# Patient Record
Sex: Female | Born: 1970 | Smoking: Former smoker
Health system: Southern US, Community
[De-identification: ages and names within clinical notes are randomized; demographics above are authoritative.]

## PROBLEM LIST (undated history)

## (undated) DIAGNOSIS — M545 Low back pain: Secondary | ICD-10-CM

## (undated) DIAGNOSIS — K5900 Constipation, unspecified: Secondary | ICD-10-CM

## (undated) DIAGNOSIS — Z8489 Family history of other specified conditions: Secondary | ICD-10-CM

## (undated) DIAGNOSIS — J45909 Unspecified asthma, uncomplicated: Secondary | ICD-10-CM

## (undated) HISTORY — DX: Low back pain: M54.5

---

## 2012-05-09 ENCOUNTER — Ambulatory Visit
Admission: RE | Admit: 2012-05-09 | Discharge: 2012-05-09 | Disposition: A | Discharge status: Home / Self Care | Payer: Worker's Compensation | Source: Ambulatory Visit | Attending: Physical Medicine and Rehabilitation | Admitting: Physical Medicine and Rehabilitation

## 2012-05-09 ENCOUNTER — Other Ambulatory Visit: Payer: Self-pay | Admitting: Physical Medicine and Rehabilitation

## 2012-05-09 DIAGNOSIS — M545 Low back pain: Secondary | ICD-10-CM

## 2012-08-13 DIAGNOSIS — M545 Low back pain, unspecified: Secondary | ICD-10-CM

## 2012-08-13 HISTORY — DX: Low back pain, unspecified: M54.50

## 2012-10-07 ENCOUNTER — Telehealth: Payer: Self-pay | Admitting: Vascular Surgery

## 2012-10-07 NOTE — Telephone Encounter (Signed)
Message copied by Rosalyn Charters on Tue Oct 07, 2012  1:45 PM ------      Message from: Phillips Odor      Created: Tue Oct 07, 2012  1:09 PM      Regarding: needs consult appt. w/ [facility]       This pt. Needs a new patient consult. Appt. With [facility], prior to surgery on 11/13/12.  She will need to bring her LS spine films to the appt. With her.  (Dr. Edilia Bo is doing the anterior exposure prior to Dr. Shon Baton procedure on 11/13/12)

## 2012-10-17 DIAGNOSIS — M549 Dorsalgia, unspecified: Secondary | ICD-10-CM | POA: Diagnosis present

## 2012-10-17 NOTE — H&P (Signed)
History of Present Illness The patient is a 41 year old female who presents today for follow up of their back. The patient is being followed for their central back pain. They are now 2 week(s) out from L4-5 and L5-S1 facet injection. The patient has not gotten any relief of their symptoms with Cortisone injections (little improvement with facet injection).    Subjective Transcription  She returns today for follow up. Based on the independent medical exam that she had done, we elected to proceed with the facet block as recommended. The patient underwent the facet block on 08/13/12. She had bilateral 4-5, 5-1 facet injections. After discussing the results, it is clear that she did not have significant improvement in her quality of life or pain reduction following the injection. At this point, it is fair assumption that the facet is not playing a major part in her pain source. This returns me back to my original concept which was the disk as the source of her pain.  Allergies Aspirin (Salicylates)   Social History Tobacco use. former smoker   Medication History Hydrocodone-Acetaminophen (10-325MG  Tablet, Oral) Active. Cyclobenzaprine HCl (10MG  Tablet, Oral) Active. Ondansetron (4MG  Tablet Disperse, Oral) Active.   Objective Transcription  She continues to have pain with forward flexion. She continues to have significant loss in quality of life. She continues to have significant low back pain with palpation, range of motion, lateral bending. There is minor discomfort into the legs, but no persisted neurological deficits. At this point in time based on her discogram and her MRI and the failure of conservative management which has consisted of injections, physical therapy, and activity modification, she would like to proceed with surgery. Prior to the facet injection, we had discussed doing a disk replacement at L5-S1 and this is how she would like to proceed.   The  discogram demonstrated normal neucleogram at 3-4 and 4-5 with an anular tear with extravasation of fluid at 5-1. According to the discogram at L3-L4 it produced familiar pain that was rated as a 5/10. 4-5 produced no pain but 5-1 elicited severe pain which was quite condordinate with her pain. At this point, even though she had two positive levels I do not think the 3-4 level produced the same severe pain. At this point, since there is anatomical abnormality of the 5-1 disc since it did cause severe pain I do think this is her pain generator. She has had injection therapy, physical therapy, activity modification, medications and her quality of life continues to suffer and her pain continues to intensify.   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   Plans Transcription  Given the failure of conservative management, the prolonged nature of this issue, I think it is reasonable to proceed. The risks of surgery include infection, bleeding, nerve damage, death, stroke, paralysis, failure to heal, need for further surgery, ongoing or worse pain, loss of bowel and bladder control. The goal of surgery is to reduce her pain and improve her quality of life and allow her to return to gainful employment. I would expect her to be in the hospital, 2 and maybe 3 nights and then home, most likely with home health service. Then at 6 weeks postop, she is to start formalized physical therapy. Somewhere between month 3 and month 4 postop, we could begin returning to  light restricted duties with a gradual buildup. My hope would be somewhere between 9 and 12 months postop that she would be back to full duties and then be at maximal medial improvement. The patient and her Mom are present for the dictation. All  of their questions have been addressed. They have expressed an understanding of the procedure, the risks and benefits, and the expectations. No change to her current work status. I will see her again for preop once she is approved. The MRI she had done was in April which is now more than 6 months old, so I will order a new one to be done prior to surgery so that we have updated pictures. If there is significant change to that MRI, then I will see her back and we will address them and if we need to, we will alter our plans.    Alvy Beal, MD/ghe

## 2012-10-23 ENCOUNTER — Encounter: Payer: Self-pay | Admitting: Vascular Surgery

## 2012-10-28 ENCOUNTER — Other Ambulatory Visit: Payer: Self-pay

## 2012-11-04 ENCOUNTER — Encounter (HOSPITAL_COMMUNITY): Payer: Self-pay | Admitting: Pharmacy Technician

## 2012-11-04 ENCOUNTER — Encounter: Payer: Self-pay | Admitting: Vascular Surgery

## 2012-11-05 ENCOUNTER — Ambulatory Visit (INDEPENDENT_AMBULATORY_CARE_PROVIDER_SITE_OTHER): Payer: Worker's Compensation | Admitting: Vascular Surgery

## 2012-11-05 ENCOUNTER — Encounter: Payer: Self-pay | Admitting: Vascular Surgery

## 2012-11-05 VITALS — BP 114/75 | HR 66 | Resp 16 | Ht 65.0 in | Wt 144.5 lb

## 2012-11-05 DIAGNOSIS — IMO0002 Reserved for concepts with insufficient information to code with codable children: Secondary | ICD-10-CM

## 2012-11-05 NOTE — Pre-Procedure Instructions (Signed)
20 Breanna Holloway  11/05/2012   Your procedure is scheduled on:  Thursday, January 16th  Report to [facility] at 5:30AM.  Call this number if you have problems the morning of surgery: [phone]   Remember:Nothing to eat or drink after Midnight.    Take these medicines the morning of surgery with A SIP OF WATER: May use Flonase. May take Tramadol (Ultram) if needed.    Do not wear jewelry, make-up or nail polish.  Do not wear lotions, powders, or perfumes. You may wear deodorant.  Do not shave 48 hours prior to surgery. Men may shave face and neck.  Do not bring valuables to the hospital.  Contacts, dentures or bridgework may not be worn into surgery.  Leave suitcase in the car. After surgery it may be brought to your room.  For patients admitted to the hospital, checkout time is 11:00 AM the day of discharge.   Patients discharged the day of surgery will not be allowed to drive home.  Name and phone number of your driver: NA    Special Instructions: Shower using CHG 2 nights before surgery and the night before surgery.  If you shower the day of surgery use CHG.  Use special wash - you have one bottle of CHG for all showers.  You should use approximately 1/3 of the bottle for each shower.   Please read over the following fact sheets that you were given: Pain Booklet, Coughing and Deep Breathing, Blood Transfusion Information and Surgical Site Infection Prevention

## 2012-11-05 NOTE — Progress Notes (Signed)
[facility]  Patient name: Breanna Holloway MRN: [id number] DOB: [birth year] Sex: female  REASON FOR CONSULT: Request for anterior retroperitoneal exposure of L5-S1. Consult from Dr. Venita Lick  HPI: Alvena Kiernan is a 42 y.o. female who injured her back approximately a year ago playing basketball. She has had persistent low back pain since that time. She has tried physical therapy, injection therapy, and pain medicine with minimal relief. Her back pain is aggravated by any activity. The pain is relieved somewhat with leg elevation.  I have reviewed her records from Dr. Shon Baton office. 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 Dr. Shon Baton is recommended anterior lumbar interbody fusion at L5-S1. I have been asked to provide exposure.  Past Medical History  Diagnosis Date  . Lower back pain 08/13/12    Central Back L 4-5 and  L 5- S 1    Cortisone injections    Family History  Problem Relation Age of Onset  . Diabetes Mother   . Hyperlipidemia Mother   . Hypertension Mother     SOCIAL HISTORY: History  Substance Use Topics  . Smoking status: Former Smoker    Types: Cigarettes    Quit date: 11/05/2009  . Smokeless tobacco: Never Used  . Alcohol Use: No    Allergies  Allergen Reactions  . Asa (Aspirin) Swelling  . Salicylates Other (See Comments)    REACTION:  unknown    Current Outpatient Prescriptions  Medication Sig Dispense Refill  . fluticasone (FLONASE) 50 MCG/ACT nasal spray Place 1 spray into the nose daily.      . Multiple Vitamin (MULTIVITAMIN WITH MINERALS) TABS Take 1 tablet by mouth daily.      . traMADol (ULTRAM) 50 MG tablet Take 50 mg by mouth 2 (two) times daily as needed. For pain        REVIEW OF SYSTEMS: Arly.Keller ] denotes positive finding; [  ] denotes negative finding  CARDIOVASCULAR:  [ ]  chest pain   [ ]  chest pressure   [ ]  palpitations   [ ]  orthopnea   [ ]  dyspnea on exertion   [ ]  claudication   [ ]  rest  pain   [ ]  DVT   [ ]  phlebitis PULMONARY:   [ ]  productive cough   Arly.Keller ] asthma   [ ]  wheezing NEUROLOGIC:   [ ]  weakness  [ ]  paresthesias  [ ]  aphasia  [ ]  amaurosis  [ ]  dizziness HEMATOLOGIC:   [ ]  bleeding problems   [ ]  clotting disorders MUSCULOSKELETAL:  [ ]  joint pain   [ ]  joint swelling [ ]  leg swelling GASTROINTESTINAL: [ ]   blood in stool  [ ]   hematemesis GENITOURINARY:  [ ]   dysuria  [ ]   hematuria PSYCHIATRIC:  [ ]  history of major depression INTEGUMENTARY:  [ ]  rashes  [ ]  ulcers CONSTITUTIONAL:  [ ]  fever   [ ]  chills  PHYSICAL EXAM: Filed Vitals:   11/05/12 0958  BP: 114/75  Pulse: 66  Resp: 16  Height: 5\' 5"  (1.651 m)  Weight: 144 lb 8 oz (65.545 kg)  SpO2: 100%   Body mass index is 24.05 kg/(m^2). GENERAL: The patient is a well-nourished female, in no acute distress. The vital signs are documented above. CARDIOVASCULAR: There is a regular rate and rhythm. I do not detect carotid bruits. He has palpable femoral pulses and weakly palpable dorsalis pedis pulses bilaterally. She has a palpable right posterior tibial pulse. I cannot palpate  a left posterior tibial pulse. PULMONARY: There is good air exchange bilaterally without wheezing or rales. ABDOMEN: Soft and non-tender with normal pitched bowel sounds.  MUSCULOSKELETAL: There are no major deformities or cyanosis. NEUROLOGIC: No focal weakness or paresthesias are detected. SKIN: There are no ulcers or rashes noted. PSYCHIATRIC: The patient has a normal affect.  DATA:  I have reviewed her CT of the lumbar spine. Do not see any evidence of significant calcification of the aorta or significant osteophytes.  MEDICAL ISSUES:  Degeneration of intervertebral disc, site unspecified This patient appears to be a good candidate for anterior retroperitoneal exposure of L5-S1. This is scheduled for 11/13/2012.I have reviewed our role in exposure of the spine in order to allow anterior lumbar interbody fusion at the  appropriate levels. We have discussed the potential complications of surgery, including but not limited to, arterial or venous injury, thrombosis, or bleeding. We have also discussed the potential risks of wound healing problems, the development of a hernia, nerve injury, leg swelling, or other unpredictable medical problems. All the patient's questions were answered and they are agreeable to proceed.    Embry Huss S [facility] [place] Beeper: [phone]

## 2012-11-05 NOTE — Assessment & Plan Note (Signed)
This patient appears to be a good candidate for anterior retroperitoneal exposure of L5-S1. This is scheduled for 11/13/2012.I have reviewed our role in exposure of the spine in order to allow anterior lumbar interbody fusion at the appropriate levels. We have discussed the potential complications of surgery, including but not limited to, arterial or venous injury, thrombosis, or bleeding. We have also discussed the potential risks of wound healing problems, the development of a hernia, nerve injury, leg swelling, or other unpredictable medical problems. All the patient's questions were answered and they are agreeable to proceed.

## 2012-11-06 ENCOUNTER — Encounter (HOSPITAL_COMMUNITY)
Admission: RE | Admit: 2012-11-06 | Discharge: 2012-11-06 | Disposition: A | Discharge status: Home / Self Care | Payer: Worker's Compensation | Source: Ambulatory Visit | Attending: Orthopedic Surgery | Admitting: Orthopedic Surgery

## 2012-11-06 ENCOUNTER — Encounter (HOSPITAL_COMMUNITY): Payer: Self-pay

## 2012-11-06 HISTORY — DX: Constipation, unspecified: K59.00

## 2012-11-06 HISTORY — DX: Family history of other specified conditions: Z84.89

## 2012-11-06 HISTORY — DX: Unspecified asthma, uncomplicated: J45.909

## 2012-11-06 LAB — CBC
Hemoglobin: 13.6 g/dL (ref 12.0–15.0)
MCH: 31.2 pg (ref 26.0–34.0)
MCHC: 33.3 g/dL (ref 30.0–36.0)
RDW: 13.1 % (ref 11.5–15.5)

## 2012-11-06 LAB — SURGICAL PCR SCREEN
MRSA, PCR: NEGATIVE
Staphylococcus aureus: NEGATIVE

## 2012-11-06 LAB — HCG, SERUM, QUALITATIVE: Preg, Serum: NEGATIVE

## 2012-11-06 LAB — TYPE AND SCREEN: ABO/RH(D): O POS

## 2012-11-06 LAB — ABO/RH: ABO/RH(D): O POS

## 2012-11-12 MED ORDER — DEXAMETHASONE SODIUM PHOSPHATE 4 MG/ML IJ SOLN
4.0000 mg | Freq: Once | INTRAMUSCULAR | Status: AC
  Administered 2012-11-13: 10 mg via INTRAVENOUS
  Filled 2012-11-12: qty 1

## 2012-11-12 MED ORDER — CEFAZOLIN SODIUM-DEXTROSE 2-3 GM-% IV SOLR
2.0000 g | INTRAVENOUS | Status: AC
  Administered 2012-11-13: 2 g via INTRAVENOUS
  Filled 2012-11-12 (×2): qty 50

## 2012-11-13 ENCOUNTER — Ambulatory Visit (HOSPITAL_COMMUNITY): Payer: Worker's Compensation

## 2012-11-13 ENCOUNTER — Encounter (HOSPITAL_COMMUNITY)
Admission: RE | Disposition: A | Discharge status: Home / Self Care | Payer: Self-pay | Source: Ambulatory Visit | Attending: Orthopedic Surgery

## 2012-11-13 ENCOUNTER — Inpatient Hospital Stay (HOSPITAL_COMMUNITY)
Admission: RE | Admit: 2012-11-13 | Discharge: 2012-11-15 | DRG: 460 | Disposition: A | Discharge status: Home / Self Care | Payer: Worker's Compensation | Source: Ambulatory Visit | Attending: Orthopedic Surgery | Admitting: Orthopedic Surgery

## 2012-11-13 ENCOUNTER — Encounter (HOSPITAL_COMMUNITY): Payer: Self-pay | Admitting: *Deleted

## 2012-11-13 ENCOUNTER — Ambulatory Visit (HOSPITAL_COMMUNITY): Payer: Worker's Compensation | Admitting: Anesthesiology

## 2012-11-13 ENCOUNTER — Encounter (HOSPITAL_COMMUNITY): Payer: Self-pay | Admitting: Anesthesiology

## 2012-11-13 DIAGNOSIS — J45909 Unspecified asthma, uncomplicated: Secondary | ICD-10-CM | POA: Diagnosis present

## 2012-11-13 DIAGNOSIS — M5137 Other intervertebral disc degeneration, lumbosacral region: Principal | ICD-10-CM | POA: Diagnosis present

## 2012-11-13 DIAGNOSIS — Z09 Encounter for follow-up examination after completed treatment for conditions other than malignant neoplasm: Secondary | ICD-10-CM

## 2012-11-13 DIAGNOSIS — M5416 Radiculopathy, lumbar region: Secondary | ICD-10-CM

## 2012-11-13 DIAGNOSIS — M519 Unspecified thoracic, thoracolumbar and lumbosacral intervertebral disc disorder: Secondary | ICD-10-CM | POA: Diagnosis present

## 2012-11-13 DIAGNOSIS — Z79899 Other long term (current) drug therapy: Secondary | ICD-10-CM

## 2012-11-13 DIAGNOSIS — M51379 Other intervertebral disc degeneration, lumbosacral region without mention of lumbar back pain or lower extremity pain: Principal | ICD-10-CM | POA: Diagnosis present

## 2012-11-13 DIAGNOSIS — Z87891 Personal history of nicotine dependence: Secondary | ICD-10-CM

## 2012-11-13 DIAGNOSIS — Z886 Allergy status to analgesic agent status: Secondary | ICD-10-CM

## 2012-11-13 DIAGNOSIS — M549 Dorsalgia, unspecified: Secondary | ICD-10-CM | POA: Diagnosis present

## 2012-11-13 DIAGNOSIS — IMO0002 Reserved for concepts with insufficient information to code with codable children: Secondary | ICD-10-CM | POA: Diagnosis present

## 2012-11-13 HISTORY — PX: ABDOMINAL EXPOSURE: SHX5708

## 2012-11-13 HISTORY — PX: ANTERIOR LUMBAR FUSION: SHX1170

## 2012-11-13 SURGERY — ANTERIOR LUMBAR FUSION 1 LEVEL
Anesthesia: General | Site: Spine Lumbar | Wound class: Clean

## 2012-11-13 MED ORDER — PHENOL 1.4 % MT LIQD: 1.0000 | OROMUCOSAL | Status: DC | PRN

## 2012-11-13 MED ORDER — ALBUMIN HUMAN 5 % IV SOLN
INTRAVENOUS | Status: DC | PRN
  Administered 2012-11-13: 08:00:00 via INTRAVENOUS

## 2012-11-13 MED ORDER — MENTHOL 3 MG MT LOZG: 1.0000 | LOZENGE | OROMUCOSAL | Status: DC | PRN

## 2012-11-13 MED ORDER — PROPOFOL 10 MG/ML IV BOLUS
INTRAVENOUS | Status: DC | PRN
  Administered 2012-11-13: 200 mg via INTRAVENOUS

## 2012-11-13 MED ORDER — LACTATED RINGERS IV SOLN
INTRAVENOUS | Status: DC
  Administered 2012-11-13: 15:00:00 via INTRAVENOUS

## 2012-11-13 MED ORDER — GLYCOPYRROLATE 0.2 MG/ML IJ SOLN
INTRAMUSCULAR | Status: DC | PRN
  Administered 2012-11-13: 0.6 mg via INTRAVENOUS

## 2012-11-13 MED ORDER — OXYCODONE HCL 5 MG PO TABS
10.0000 mg | ORAL_TABLET | ORAL | Status: DC | PRN
  Administered 2012-11-14 – 2012-11-15 (×6): 10 mg via ORAL
  Filled 2012-11-13 (×6): qty 2

## 2012-11-13 MED ORDER — ONDANSETRON HCL 4 MG/2ML IJ SOLN: 4.0000 mg | INTRAMUSCULAR | Status: DC | PRN

## 2012-11-13 MED ORDER — SODIUM CHLORIDE 0.9 % IJ SOLN
3.0000 mL | Freq: Two times a day (BID) | INTRAMUSCULAR | Status: DC
  Administered 2012-11-13 – 2012-11-14 (×2): 3 mL via INTRAVENOUS

## 2012-11-13 MED ORDER — DEXTROSE 5 % IV SOLN
500.0000 mg | Freq: Four times a day (QID) | INTRAVENOUS | Status: DC | PRN
  Administered 2012-11-13: 500 mg via INTRAVENOUS
  Filled 2012-11-13: qty 5

## 2012-11-13 MED ORDER — MIDAZOLAM HCL 5 MG/5ML IJ SOLN
INTRAMUSCULAR | Status: DC | PRN
  Administered 2012-11-13: 2 mg via INTRAVENOUS

## 2012-11-13 MED ORDER — 0.9 % SODIUM CHLORIDE (POUR BTL) OPTIME
TOPICAL | Status: DC | PRN
  Administered 2012-11-13: 1000 mL

## 2012-11-13 MED ORDER — ROCURONIUM BROMIDE 100 MG/10ML IV SOLN
INTRAVENOUS | Status: DC | PRN
  Administered 2012-11-13 (×4): 10 mg via INTRAVENOUS
  Administered 2012-11-13: 50 mg via INTRAVENOUS

## 2012-11-13 MED ORDER — HEMOSTATIC AGENTS (NO CHARGE) OPTIME
TOPICAL | Status: DC | PRN
  Administered 2012-11-13: 1 via TOPICAL

## 2012-11-13 MED ORDER — ACETAMINOPHEN 10 MG/ML IV SOLN: 1000.0000 mg | Freq: Once | INTRAVENOUS | Status: DC

## 2012-11-13 MED ORDER — ALBUTEROL SULFATE (5 MG/ML) 0.5% IN NEBU: 2.5000 mg | INHALATION_SOLUTION | Freq: Four times a day (QID) | RESPIRATORY_TRACT | Status: DC | PRN

## 2012-11-13 MED ORDER — HYDROMORPHONE HCL PF 1 MG/ML IJ SOLN
0.2500 mg | INTRAMUSCULAR | Status: DC | PRN
  Administered 2012-11-13 (×3): 0.5 mg via INTRAVENOUS

## 2012-11-13 MED ORDER — CEFAZOLIN SODIUM 1-5 GM-% IV SOLN
1.0000 g | Freq: Three times a day (TID) | INTRAVENOUS | Status: AC
  Administered 2012-11-13 – 2012-11-14 (×2): 1 g via INTRAVENOUS
  Filled 2012-11-13 (×2): qty 50

## 2012-11-13 MED ORDER — SODIUM CHLORIDE 0.9 % IJ SOLN: 3.0000 mL | INTRAMUSCULAR | Status: DC | PRN

## 2012-11-13 MED ORDER — PROMETHAZINE HCL 25 MG/ML IJ SOLN: 6.2500 mg | INTRAMUSCULAR | Status: DC | PRN

## 2012-11-13 MED ORDER — ALBUTEROL SULFATE HFA 108 (90 BASE) MCG/ACT IN AERS
INHALATION_SPRAY | RESPIRATORY_TRACT | Status: DC | PRN
  Administered 2012-11-13: 2 via RESPIRATORY_TRACT

## 2012-11-13 MED ORDER — DEXAMETHASONE 4 MG PO TABS
4.0000 mg | ORAL_TABLET | Freq: Four times a day (QID) | ORAL | Status: DC
  Administered 2012-11-13 – 2012-11-15 (×8): 4 mg via ORAL
  Filled 2012-11-13 (×12): qty 1

## 2012-11-13 MED ORDER — HYDROMORPHONE HCL PF 1 MG/ML IJ SOLN
INTRAMUSCULAR | Status: AC
  Filled 2012-11-13: qty 1

## 2012-11-13 MED ORDER — OXYCODONE HCL 5 MG/5ML PO SOLN
5.0000 mg | Freq: Once | ORAL | Status: AC | PRN
Start: 2012-11-13 — End: 2012-11-13

## 2012-11-13 MED ORDER — MORPHINE SULFATE (PF) 1 MG/ML IV SOLN
INTRAVENOUS | Status: DC
  Administered 2012-11-13: 5 mg via INTRAVENOUS
  Administered 2012-11-13: 11:00:00 via INTRAVENOUS
  Administered 2012-11-14 (×2): 3 mg via INTRAVENOUS

## 2012-11-13 MED ORDER — OXYCODONE HCL 5 MG PO TABS
5.0000 mg | ORAL_TABLET | Freq: Once | ORAL | Status: AC | PRN
  Administered 2012-11-13: 5 mg via ORAL

## 2012-11-13 MED ORDER — LIDOCAINE HCL (CARDIAC) 20 MG/ML IV SOLN
INTRAVENOUS | Status: DC | PRN
  Administered 2012-11-13: 50 mg via INTRAVENOUS

## 2012-11-13 MED ORDER — ONDANSETRON HCL 4 MG/2ML IJ SOLN
INTRAMUSCULAR | Status: DC | PRN
  Administered 2012-11-13: 4 mg via INTRAVENOUS

## 2012-11-13 MED ORDER — DIPHENHYDRAMINE HCL 50 MG/ML IJ SOLN: 12.5000 mg | Freq: Four times a day (QID) | INTRAMUSCULAR | Status: DC | PRN

## 2012-11-13 MED ORDER — FLUTICASONE PROPIONATE 50 MCG/ACT NA SUSP
1.0000 | Freq: Every day | NASAL | Status: DC
  Administered 2012-11-13 – 2012-11-15 (×3): 1 via NASAL
  Filled 2012-11-13: qty 16

## 2012-11-13 MED ORDER — ZOLPIDEM TARTRATE 5 MG PO TABS: 5.0000 mg | ORAL_TABLET | Freq: Every evening | ORAL | Status: DC | PRN

## 2012-11-13 MED ORDER — NALOXONE HCL 0.4 MG/ML IJ SOLN: 0.4000 mg | INTRAMUSCULAR | Status: DC | PRN

## 2012-11-13 MED ORDER — NEOSTIGMINE METHYLSULFATE 1 MG/ML IJ SOLN
INTRAMUSCULAR | Status: DC | PRN
  Administered 2012-11-13: 3 mg via INTRAVENOUS

## 2012-11-13 MED ORDER — DIPHENHYDRAMINE HCL 12.5 MG/5ML PO ELIX: 12.5000 mg | ORAL_SOLUTION | Freq: Four times a day (QID) | ORAL | Status: DC | PRN

## 2012-11-13 MED ORDER — MORPHINE SULFATE (PF) 1 MG/ML IV SOLN
INTRAVENOUS | Status: AC
  Filled 2012-11-13: qty 25

## 2012-11-13 MED ORDER — DEXAMETHASONE SODIUM PHOSPHATE 4 MG/ML IJ SOLN
4.0000 mg | Freq: Four times a day (QID) | INTRAMUSCULAR | Status: DC
  Filled 2012-11-13 (×12): qty 1

## 2012-11-13 MED ORDER — ONDANSETRON HCL 4 MG/2ML IJ SOLN: 4.0000 mg | Freq: Four times a day (QID) | INTRAMUSCULAR | Status: DC | PRN

## 2012-11-13 MED ORDER — DEXAMETHASONE SODIUM PHOSPHATE 10 MG/ML IJ SOLN
INTRAMUSCULAR | Status: AC
  Filled 2012-11-13: qty 1

## 2012-11-13 MED ORDER — SODIUM CHLORIDE 0.9 % IV SOLN: 250.0000 mL | INTRAVENOUS | Status: DC

## 2012-11-13 MED ORDER — METHOCARBAMOL 500 MG PO TABS
500.0000 mg | ORAL_TABLET | Freq: Four times a day (QID) | ORAL | Status: DC | PRN
  Filled 2012-11-13: qty 1

## 2012-11-13 MED ORDER — SODIUM CHLORIDE 0.9 % IJ SOLN: 9.0000 mL | INTRAMUSCULAR | Status: DC | PRN

## 2012-11-13 MED ORDER — LACTATED RINGERS IV SOLN
INTRAVENOUS | Status: DC | PRN
  Administered 2012-11-13 (×2): via INTRAVENOUS

## 2012-11-13 MED ORDER — ENOXAPARIN SODIUM 30 MG/0.3ML ~~LOC~~ SOLN
30.0000 mg | SUBCUTANEOUS | Status: DC
  Administered 2012-11-14 (×2): 30 mg via SUBCUTANEOUS
  Filled 2012-11-13 (×3): qty 0.3

## 2012-11-13 MED ORDER — OXYCODONE HCL 5 MG PO TABS
ORAL_TABLET | ORAL | Status: AC
  Filled 2012-11-13: qty 1

## 2012-11-13 MED ORDER — FENTANYL CITRATE 0.05 MG/ML IJ SOLN
INTRAMUSCULAR | Status: DC | PRN
  Administered 2012-11-13: 150 ug via INTRAVENOUS
  Administered 2012-11-13: 100 ug via INTRAVENOUS
  Administered 2012-11-13: 50 ug via INTRAVENOUS
  Administered 2012-11-13: 100 ug via INTRAVENOUS

## 2012-11-13 SURGICAL SUPPLY — 83 items
APPLIER CLIP 11 MED OPEN (CLIP) ×6
BLADE SURG 10 STRL SS (BLADE) ×3 IMPLANT
BLADE SURG ROTATE 9660 (MISCELLANEOUS) IMPLANT
CLIP APPLIE 11 MED OPEN (CLIP) ×4 IMPLANT
CLOTH BEACON ORANGE TIMEOUT ST (SAFETY) ×6 IMPLANT
CLSR STERI-STRIP ANTIMIC 1/2X4 (GAUZE/BANDAGES/DRESSINGS) ×3 IMPLANT
CORDS BIPOLAR (ELECTRODE) ×3 IMPLANT
COVER SURGICAL LIGHT HANDLE (MISCELLANEOUS) ×6 IMPLANT
COVER TABLE BACK 60X90 (DRAPES) ×3 IMPLANT
DERMABOND ADVANCED (GAUZE/BANDAGES/DRESSINGS)
DERMABOND ADVANCED .7 DNX12 (GAUZE/BANDAGES/DRESSINGS) IMPLANT
DRAPE C-ARM 42X72 X-RAY (DRAPES) ×6 IMPLANT
DRAPE INCISE IOBAN 66X45 STRL (DRAPES) IMPLANT
DRAPE POUCH INSTRU U-SHP 10X18 (DRAPES) ×3 IMPLANT
DRAPE SURG 17X23 STRL (DRAPES) ×3 IMPLANT
DRAPE U-SHAPE 47X51 STRL (DRAPES) ×3 IMPLANT
DRSG MEPILEX BORDER 4X8 (GAUZE/BANDAGES/DRESSINGS) ×3 IMPLANT
DURAPREP 26ML APPLICATOR (WOUND CARE) ×3 IMPLANT
ELECT BLADE 4.0 EZ CLEAN MEGAD (MISCELLANEOUS) ×3
ELECT CAUTERY BLADE 6.4 (BLADE) ×3 IMPLANT
ELECT REM PT RETURN 9FT ADLT (ELECTROSURGICAL) ×3
ELECTRODE BLDE 4.0 EZ CLN MEGD (MISCELLANEOUS) ×2 IMPLANT
ELECTRODE REM PT RTRN 9FT ADLT (ELECTROSURGICAL) ×2 IMPLANT
GAUZE SPONGE 4X4 16PLY XRAY LF (GAUZE/BANDAGES/DRESSINGS) IMPLANT
GLOVE BIOGEL PI IND STRL 6.5 (GLOVE) IMPLANT
GLOVE BIOGEL PI IND STRL 8 (GLOVE) ×2 IMPLANT
GLOVE BIOGEL PI IND STRL 8.5 (GLOVE) ×4 IMPLANT
GLOVE BIOGEL PI INDICATOR 6.5 (GLOVE)
GLOVE BIOGEL PI INDICATOR 8 (GLOVE) ×1
GLOVE BIOGEL PI INDICATOR 8.5 (GLOVE) ×2
GLOVE ECLIPSE 6.0 STRL STRAW (GLOVE) IMPLANT
GLOVE ECLIPSE 7.5 STRL STRAW (GLOVE) ×3 IMPLANT
GLOVE ECLIPSE 8.5 STRL (GLOVE) ×6 IMPLANT
GOWN PREVENTION PLUS XXLARGE (GOWN DISPOSABLE) ×6 IMPLANT
GOWN STRL NON-REIN LRG LVL3 (GOWN DISPOSABLE) ×9 IMPLANT
HEMOSTAT SNOW SURGICEL 2X4 (HEMOSTASIS) IMPLANT
INLAY POLYETHYLENE W/TANTALUM (Orthopedic Implant) ×3 IMPLANT
INSERT FOGARTY 61MM (MISCELLANEOUS) IMPLANT
INSERT FOGARTY SM (MISCELLANEOUS) IMPLANT
KIT BASIN OR (CUSTOM PROCEDURE TRAY) ×3 IMPLANT
KIT ROOM TURNOVER OR (KITS) ×6 IMPLANT
LOOP VESSEL MAXI BLUE (MISCELLANEOUS) ×3 IMPLANT
LOOP VESSEL MINI RED (MISCELLANEOUS) ×3 IMPLANT
NEEDLE SPNL 18GX3.5 QUINCKE PK (NEEDLE) ×3 IMPLANT
NS IRRIG 1000ML POUR BTL (IV SOLUTION) ×3 IMPLANT
PACK LAMINECTOMY ORTHO (CUSTOM PROCEDURE TRAY) ×3 IMPLANT
PACK UNIVERSAL I (CUSTOM PROCEDURE TRAY) ×3 IMPLANT
PAD ARMBOARD 7.5X6 YLW CONV (MISCELLANEOUS) ×12 IMPLANT
PLATE END SUPERIOR (Plate) ×3 IMPLANT
PLATE INFERIOR END (Plate) ×3 IMPLANT
SPONGE INTESTINAL PEANUT (DISPOSABLE) ×6 IMPLANT
SPONGE LAP 18X18 X RAY DECT (DISPOSABLE) ×3 IMPLANT
SPONGE LAP 4X18 X RAY DECT (DISPOSABLE) ×3 IMPLANT
SPONGE SURGIFOAM ABS GEL 100 (HEMOSTASIS) ×3 IMPLANT
STAPLER VISISTAT 35W (STAPLE) IMPLANT
STRIP CLOSURE SKIN 1/2X4 (GAUZE/BANDAGES/DRESSINGS) ×3 IMPLANT
SURGIFLO TRUKIT (HEMOSTASIS) ×3 IMPLANT
SUT MNCRL AB 3-0 PS2 18 (SUTURE) ×3 IMPLANT
SUT PDS AB 1 CTX 36 (SUTURE) ×3 IMPLANT
SUT PROLENE 4 0 RB 1 (SUTURE) ×4
SUT PROLENE 4-0 RB1 .5 CRCL 36 (SUTURE) ×8 IMPLANT
SUT PROLENE 5 0 C 1 24 (SUTURE) IMPLANT
SUT PROLENE 5 0 CC1 (SUTURE) IMPLANT
SUT PROLENE 6 0 C 1 30 (SUTURE) IMPLANT
SUT PROLENE 6 0 CC (SUTURE) IMPLANT
SUT SILK 0 TIES 10X30 (SUTURE) ×3 IMPLANT
SUT SILK 2 0 TIES 10X30 (SUTURE) ×6 IMPLANT
SUT SILK 2 0SH CR/8 30 (SUTURE) IMPLANT
SUT SILK 3 0 TIES 10X30 (SUTURE) ×6 IMPLANT
SUT SILK 3 0SH CR/8 30 (SUTURE) IMPLANT
SUT VIC AB 0 CT1 27 (SUTURE) ×1
SUT VIC AB 0 CT1 27XBRD ANBCTR (SUTURE) ×2 IMPLANT
SUT VIC AB 1 CT1 27 (SUTURE) ×2
SUT VIC AB 1 CT1 27XBRD ANBCTR (SUTURE) ×4 IMPLANT
SUT VIC AB 2-0 CT1 18 (SUTURE) ×3 IMPLANT
SUT VIC AB 2-0 CTB1 (SUTURE) IMPLANT
SUT VIC AB 3-0 SH 27 (SUTURE) ×1
SUT VIC AB 3-0 SH 27X BRD (SUTURE) ×2 IMPLANT
SYR BULB IRRIGATION 50ML (SYRINGE) ×3 IMPLANT
TOWEL OR 17X24 6PK STRL BLUE (TOWEL DISPOSABLE) ×6 IMPLANT
TOWEL OR 17X26 10 PK STRL BLUE (TOWEL DISPOSABLE) ×6 IMPLANT
TRAY FOLEY CATH 14FRSI W/METER (CATHETERS) ×3 IMPLANT
WATER STERILE IRR 1000ML POUR (IV SOLUTION) ×3 IMPLANT

## 2012-11-13 NOTE — H&P (Signed)
No change in clinical exam H+P reviewed  

## 2012-11-13 NOTE — Progress Notes (Signed)
UR COMPLETED  

## 2012-11-13 NOTE — Plan of Care (Signed)
Problem: Consults Goal: Diagnosis - Spinal Surgery Thoraco/Lumbar Spine Fusion     

## 2012-11-13 NOTE — Op Note (Signed)
NAMEClarise Holloway  MRN: [id number] DOB: November 02, 1970    DATE OF OPERATION: 11/13/2012  PREOP DIAGNOSIS: degenerative disc disease L5-S1  POSTOP DIAGNOSIS: same  PROCEDURE: anterior retroperitoneal exposure of L5-S1  SURGEON: Di Kindle. Edilia Bo, MD, FACS  ASSIST: Jolaine Artist, RNFA  ANESTHESIA: Gen.   EBL: minimal  INDICATIONS: Breanna Holloway is a 42 y.o. female with degenerative disc disease at L5-S1. She was felt to be a good candidate for ALIF. I was asked to provide anterior retroperitoneal exposure.  FINDINGS: degenerative disc disease L5-S1  TECHNIQUE: The patient was brought to the operating room and received a general anesthetic. The abdomen was prepped and draped in usual sterile fashion after the level of the L5-S1 disc was marked. A transverse incision was made on the left at the level that had been marked. The incision was carried down to the anterior rectus sheath. The anterior rectus sheath was divided transversely and then mobilized superiorly and inferiorly from the rectus abdominis muscle. The rectus abdominis muscle was mobilized circumferentially and initially retracted laterally. This allowed exposure of the transversalis fascia laterally. This was incised allowing entry into the retroperitoneal space. The retroperitoneal space was dissected free to the level of the psoas. And then the external and internal iliac arteries were identified. The dissection was continued superiorly into the patient's right allowing exposure of the sacral promontory. The middle sacral vessels were cauterized. The disc was then exposed and the reverse lip retractors were placed on the right and then left using the Thompson retractor to allow adequate exposure of the L5-S1 disc. The ALIF was performed by Dr. Shon Baton as dictated separately. The remainder of the procedure is as dictated by Dr. Shon Baton.  Waverly Ferrari, MD, FACS [facility]  DATE OF  DICTATION:   11/13/2012

## 2012-11-13 NOTE — Anesthesia Procedure Notes (Signed)
Procedure Name: Intubation Date/Time: 11/13/2012 7:42 AM Performed by: Gwenyth Allegra Pre-anesthesia Checklist: Timeout performed, Patient identified, Emergency Drugs available, Suction available and Patient being monitored Oxygen Delivery Method: Circle system utilized Preoxygenation: Pre-oxygenation with 100% oxygen Intubation Type: IV induction Ventilation: Mask ventilation without difficulty Laryngoscope Size: 3 Grade View: Grade I Tube type: Oral Tube size: 7.0 mm Number of attempts: 1 Airway Equipment and Method: Stylet Secured at: 21 cm Tube secured with: Tape Dental Injury: Teeth and Oropharynx as per pre-operative assessment

## 2012-11-13 NOTE — Anesthesia Postprocedure Evaluation (Signed)
Anesthesia Post Note  Patient: Breanna Holloway  Procedure(s) Performed: Procedure(s) (LRB): ANTERIOR LUMBAR FUSION 1 LEVEL (N/A) ABDOMINAL EXPOSURE (N/A)  Anesthesia type: general  Patient location: PACU  Post pain: Pain level controlled  Post assessment: Patient's Cardiovascular Status Stable  Last Vitals:  Filed Vitals:   11/13/12 1215  BP:   Pulse:   Temp: 36.6 C  Resp:     Post vital signs: Reviewed and stable  Level of consciousness: sedated  Complications: No apparent anesthesia complications

## 2012-11-13 NOTE — Op Note (Signed)
NAMEJAXIE, Breanna Holloway NO.:  [id number]  MEDICAL RECORD NO.:  [id number]  LOCATION:  5N09C                        FACILITY:  [facility]  PHYSICIAN:  Alvy Beal, MD    DATE OF BIRTH:  July 26, 1971  DATE OF PROCEDURE:  11/13/2012 DATE OF DISCHARGE:                              OPERATIVE REPORT   PREOPERATIVE DIAGNOSIS:  Discogenic low back pain, L5-S1.  POSTOPERATIVE DIAGNOSIS:  Discogenic low back pain, L5-S1.  OPERATIVE PROCEDURE:  Total disk arthroplasty, L5-S1.  INSTRUMENTATION SYSTEM USED:  ProDisc lumbar, medium size 10, 11 degree lordotic implant.  COMPLICATIONS:  No complications.  APPROACH SURGEON:  Di Kindle. Edilia Bo, M.D.  HISTORY:  This is a very pleasant woman who injured herself several years ago on the job.  Despite long-term appropriate conservative management, her symptoms continued to progress and her quality of life continued to deteriorate.  As a result of the failure to improve despite conservative management, we elected to proceed with surgery.  All appropriate risks, benefits, alternatives were discussed and consent was obtained.  OPERATIVE NOTE:  The patient was brought to the operating room and placed supine on the operating table.  After successful induction of general anesthesia and endotracheal intubation, TEDs, SCDs, and a Foley were inserted.  The abdomen was prepped and draped in a standard fashion.  Once this was done, Dr. Edilia Bo then scrubbed in and performed a standard retroperitoneal approach to the lumbar spine.  Please refer to his dictation for specifics.  Once the [facility] self-retracting blades were placed and we had excellent visualization of the L5-S1 disk, I scrubbed into the case and began the diskectomy portion of the surgery.  I placed a needle into the 5-1 disk space and took an x-ray to confirm that this was the appropriate level.  Once this was done, I then incised the 5-1 disk and performed an  annulotomy with a #10 blade scalpel.  I then used curettes and pituitary rongeurs to remove all the disk material at the L5-S1 level.  Once I had scraped the disk to expose subchondral bone, I proceeded with my decompression posteriorly.  Once I was at the posterior rim of the vertebral body, I took a small angled curette and released the anulus from the L5 and S1 posterior portions of the vertebral body.  I then removed all the remaining portions of the disk until I had just looking anterior and posterior anulus which was released.  I then under live fluoro confirmed that I had parallel distraction of the endplates.  Once this was done, I then placed a trial device into the spine and malleted to the appropriate depth.  Once I confirmed satisfactory position in the lateral plane, I went to the AP and then confirmed that my implant was midline based on the spinous process.  Once this was done, I marked the midline and then took the chill and created a thin cut into the L5 and S1 endplates.  I then took the appropriate-sized implant and malleted down to the same depth as the trial was.  Once this was done, I then took the _______ and placed it on the implanting device and placed a catheter.  I then confirmed that there was  no step-off and no gap.  I then removed the implanting devices and then irrigated the wound copiously with normal saline.  I placed bone wax over the exposed bleeding bone edges and then made sure I had hemostasis using bipolar electrocautery.  I then irrigated copiously with normal saline.  I then sequentially removed the retractors.  There was no bleeding noted.  At this point, I then took final AP and lateral x-rays confirming satisfactory position of the implant.  Final KUB digital x-ray was taken and confirmed there was no retained surgical instrumentation in the abdomen.  The counts were then correct.  I then closed the rectus fascia with a running Prolene suture.   Then, I did a running #1 Vicryl suture, interrupted 2-0 Vicryl sutures, and then 3-0 Monocryl for the skin.  Steri-Strips and dry dressing were applied.  The patient was then extubated and transferred to PACU without incident.  At the end of the case, all needle and sponge counts were correct.     Alvy Beal, MD     DDB/MEDQ  D:  11/13/2012  T:  11/13/2012  Job:  [id number]

## 2012-11-13 NOTE — Progress Notes (Signed)
VASCULAR LAB PRELIMINARY  PRELIMINARY  PRELIMINARY  PRELIMINARY  Bilateral lower extremity venous duplex  completed.    Preliminary report:  Bilateral:  No evidence of DVT, superficial thrombosis, or Baker's Cyst.    Breanna Holloway, RVT 11/13/2012, 3:18 PM

## 2012-11-13 NOTE — Interval H&P Note (Signed)
History and Physical Interval Note:  11/13/2012 7:06 AM  Breanna Holloway  has presented today for surgery, with the diagnosis of discogenic lumbar back pain  The various methods of treatment have been discussed with the patient and family. After consideration of risks, benefits and other options for treatment, the patient has consented to  Procedure(s) (LRB) with comments: ANTERIOR LUMBAR FUSION 1 LEVEL (N/A) - total disc replacement vs ALIF L5-S1 ABDOMINAL EXPOSURE (N/A) as a surgical intervention .  The patient's history has been reviewed, patient examined, no change in status, stable for surgery.  I have reviewed the patient's chart and labs.  Questions were answered to the patient's satisfaction.     Josue Falconi S

## 2012-11-13 NOTE — Transfer of Care (Signed)
Immediate Anesthesia Transfer of Care Note  Patient: Breanna Holloway  Procedure(s) Performed: Procedure(s) (LRB) with comments: ANTERIOR LUMBAR FUSION 1 LEVEL (N/A) - total disc replacement L5-S1 ABDOMINAL EXPOSURE (N/A) - exposure for anterior lumbar surgery  Patient Location: PACU  Anesthesia Type:General  Level of Consciousness: awake and alert   Airway & Oxygen Therapy: Patient Spontanous Breathing and Patient connected to nasal cannula oxygen  Post-op Assessment: Report given to PACU RN and Post -op Vital signs reviewed and stable  Post vital signs: Reviewed and stable  Complications: No apparent anesthesia complications

## 2012-11-13 NOTE — Preoperative (Signed)
Beta Blockers   Reason not to administer Beta Blockers:Not Applicable2 

## 2012-11-13 NOTE — Brief Op Note (Signed)
11/13/2012  10:22 AM  PATIENT:  Breanna Holloway  42 y.o. female  PRE-OPERATIVE DIAGNOSIS:  discogenic lumbar back pain  POST-OPERATIVE DIAGNOSIS:  discogenic lumbar back pain  PROCEDURE:  Procedure(s) (LRB) with comments: ANTERIOR LUMBAR FUSION 1 LEVEL (N/A) - total disc replacement L5-S1 ABDOMINAL EXPOSURE (N/A) - exposure for anterior lumbar surgery  SURGEON:  Surgeon(s) and Role: Panel 1:    * Venita Lick, MD - Primary  Panel 2:    * Chuck Hint, MD - Primary  PHYSICIAN ASSISTANT:   ASSISTANTS: approach surgeon: Dr Edilia Bo   ANESTHESIA:   general  EBL:  Total I/O In: 1250 [I.V.:1000; IV Piggyback:250] Out: 550 [Urine:450; Blood:100]  BLOOD ADMINISTERED:none  DRAINS: none   LOCAL MEDICATIONS USED:  none  SPECIMEN:  No Specimen  DISPOSITION OF SPECIMEN:  N/A  COUNTS:  YES  TOURNIQUET:  * No tourniquets in log *  DICTATION: .Other Dictation: Dictation Number [id number]  PLAN OF CARE: Admit to inpatient   PATIENT DISPOSITION:  PACU - hemodynamically stable.

## 2012-11-13 NOTE — Anesthesia Preprocedure Evaluation (Addendum)
Anesthesia Evaluation  Patient identified by MRN, date of birth, ID band Patient awake    Reviewed: Allergy & Precautions, H&P , NPO status , Patient's Chart, lab work & pertinent test results  History of Anesthesia Complications Negative for: history of anesthetic complications  Airway Mallampati: I TM Distance: >3 FB Neck ROM: Full    Dental  (+) Teeth Intact and Dental Advisory Given   Pulmonary asthma , former smoker,    Pulmonary exam normal       Cardiovascular negative cardio ROS      Neuro/Psych negative neurological ROS  negative psych ROS   GI/Hepatic negative GI ROS, Neg liver ROS,   Endo/Other  negative endocrine ROS  Renal/GU negative Renal ROS     Musculoskeletal   Abdominal   Peds  Hematology   Anesthesia Other Findings   Reproductive/Obstetrics                          Anesthesia Physical Anesthesia Plan  ASA: II  Anesthesia Plan: General   Post-op Pain Management:    Induction: Intravenous  Airway Management Planned: Oral ETT  Additional Equipment:   Intra-op Plan:   Post-operative Plan: Extubation in OR  Informed Consent: I have reviewed the patients History and Physical, chart, labs and discussed the procedure including the risks, benefits and alternatives for the proposed anesthesia with the patient or authorized representative who has indicated his/her understanding and acceptance.   Dental advisory given  Plan Discussed with: CRNA, Anesthesiologist and Surgeon  Anesthesia Plan Comments:        Anesthesia Quick Evaluation

## 2012-11-13 NOTE — Progress Notes (Signed)
VASCULAR PROGRESS NOTE  SUBJECTIVE: Pain adequately controlled.   PHYSICAL EXAM: Filed Vitals:   11/13/12 1400  BP: 102/57  Pulse: 93  Temp: 97.9 F (36.6 C)  Resp: 15   Palpable pedal pulses on left.  ASSESSMENT/PLAN: Doing well postop.  Cari Caraway Beeper: [phone] 11/13/2012

## 2012-11-13 NOTE — H&P (View-Only) (Signed)
[facility]  Patient name: Breanna Holloway MRN: [id number] DOB: 01/31/1971 Sex: female  REASON FOR CONSULT: Request for anterior retroperitoneal exposure of L5-S1. Consult from Dr. Dahari Brooks  HPI: Breanna Holloway is a 41 y.o. female who injured her back approximately a year ago playing basketball. She has had persistent low back pain since that time. She has tried physical therapy, injection therapy, and pain medicine with minimal relief. Her back pain is aggravated by any activity. The pain is relieved somewhat with leg elevation.  I have reviewed her records from Dr. Brooks office. 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 Dr. Brooks is recommended anterior lumbar interbody fusion at L5-S1. I have been asked to provide exposure.  Past Medical History  Diagnosis Date  . Lower back pain 08/13/12    Central Back L 4-5 and  L 5- S 1    Cortisone injections    Family History  Problem Relation Age of Onset  . Diabetes Mother   . Hyperlipidemia Mother   . Hypertension Mother     SOCIAL HISTORY: History  Substance Use Topics  . Smoking status: Former Smoker    Types: Cigarettes    Quit date: 11/05/2009  . Smokeless tobacco: Never Used  . Alcohol Use: No    Allergies  Allergen Reactions  . Asa (Aspirin) Swelling  . Salicylates Other (See Comments)    REACTION:  unknown    Current Outpatient Prescriptions  Medication Sig Dispense Refill  . fluticasone (FLONASE) 50 MCG/ACT nasal spray Place 1 spray into the nose daily.      . Multiple Vitamin (MULTIVITAMIN WITH MINERALS) TABS Take 1 tablet by mouth daily.      . traMADol (ULTRAM) 50 MG tablet Take 50 mg by mouth 2 (two) times daily as needed. For pain        REVIEW OF SYSTEMS: [X ] denotes positive finding; [  ] denotes negative finding  CARDIOVASCULAR:  [ ] chest pain   [ ] chest pressure   [ ] palpitations   [ ] orthopnea   [ ] dyspnea on exertion   [ ] claudication   [ ] rest  pain   [ ] DVT   [ ] phlebitis PULMONARY:   [ ] productive cough   [X ] asthma   [ ] wheezing NEUROLOGIC:   [ ] weakness  [ ] paresthesias  [ ] aphasia  [ ] amaurosis  [ ] dizziness HEMATOLOGIC:   [ ] bleeding problems   [ ] clotting disorders MUSCULOSKELETAL:  [ ] joint pain   [ ] joint swelling [ ] leg swelling GASTROINTESTINAL: [ ]  blood in stool  [ ]  hematemesis GENITOURINARY:  [ ]  dysuria  [ ]  hematuria PSYCHIATRIC:  [ ] history of major depression INTEGUMENTARY:  [ ] rashes  [ ] ulcers CONSTITUTIONAL:  [ ] fever   [ ] chills  PHYSICAL EXAM: Filed Vitals:   11/05/12 0958  BP: 114/75  Pulse: 66  Resp: 16  Height: 5' 5" (1.651 m)  Weight: 144 lb 8 oz (65.545 kg)  SpO2: 100%   Body mass index is 24.05 kg/(m^2). GENERAL: The patient is a well-nourished female, in no acute distress. The vital signs are documented above. CARDIOVASCULAR: There is a regular rate and rhythm. I do not detect carotid bruits. He has palpable femoral pulses and weakly palpable dorsalis pedis pulses bilaterally. She has a palpable right posterior tibial pulse. I cannot palpate   a left posterior tibial pulse. PULMONARY: There is good air exchange bilaterally without wheezing or rales. ABDOMEN: Soft and non-tender with normal pitched bowel sounds.  MUSCULOSKELETAL: There are no major deformities or cyanosis. NEUROLOGIC: No focal weakness or paresthesias are detected. SKIN: There are no ulcers or rashes noted. PSYCHIATRIC: The patient has a normal affect.  DATA:  I have reviewed her CT of the lumbar spine. Do not see any evidence of significant calcification of the aorta or significant osteophytes.  MEDICAL ISSUES:  Degeneration of intervertebral disc, site unspecified This patient appears to be a good candidate for anterior retroperitoneal exposure of L5-S1. This is scheduled for 11/13/2012.I have reviewed our role in exposure of the spine in order to allow anterior lumbar interbody fusion at the  appropriate levels. We have discussed the potential complications of surgery, including but not limited to, arterial or venous injury, thrombosis, or bleeding. We have also discussed the potential risks of wound healing problems, the development of a hernia, nerve injury, leg swelling, or other unpredictable medical problems. All the patient's questions were answered and they are agreeable to proceed.    Jackelyn Illingworth S [facility] Beeper: [phone]    

## 2012-11-14 ENCOUNTER — Encounter (HOSPITAL_COMMUNITY): Payer: Self-pay | Admitting: Orthopedic Surgery

## 2012-11-14 ENCOUNTER — Inpatient Hospital Stay (HOSPITAL_COMMUNITY): Payer: Worker's Compensation

## 2012-11-14 MED ORDER — METHOCARBAMOL 500 MG PO TABS: 500.0000 mg | ORAL_TABLET | Freq: Three times a day (TID) | ORAL | Status: AC | PRN

## 2012-11-14 MED ORDER — ONDANSETRON HCL 4 MG PO TABS: 4.0000 mg | ORAL_TABLET | Freq: Three times a day (TID) | ORAL | Status: AC | PRN

## 2012-11-14 MED ORDER — OXYCODONE-ACETAMINOPHEN 10-325 MG PO TABS: 1.0000 | ORAL_TABLET | ORAL | Status: AC | PRN

## 2012-11-14 MED ORDER — DOCUSATE SODIUM 100 MG PO CAPS: 100.0000 mg | ORAL_CAPSULE | Freq: Three times a day (TID) | ORAL | Status: AC | PRN

## 2012-11-14 MED ORDER — ENOXAPARIN SODIUM 30 MG/0.3ML ~~LOC~~ SOLN: 30.0000 mg | Freq: Two times a day (BID) | SUBCUTANEOUS | Status: AC

## 2012-11-14 MED ORDER — POLYETHYLENE GLYCOL 3350 17 GM/SCOOP PO POWD
17.0000 g | Freq: Every day | ORAL | Status: AC
Start: 2012-11-14 — End: ?

## 2012-11-14 MED ORDER — POLYETHYLENE GLYCOL 3350 17 G PO PACK
17.0000 g | PACK | Freq: Every day | ORAL | Status: DC
  Administered 2012-11-14: 17 g via ORAL
  Filled 2012-11-14 (×2): qty 1

## 2012-11-14 MED FILL — Sodium Chloride IV Soln 0.9%: INTRAVENOUS | Qty: 1000 | Status: AC

## 2012-11-14 MED FILL — Sodium Chloride Irrigation Soln 0.9%: Qty: 3000 | Status: AC

## 2012-11-14 MED FILL — Heparin Sodium (Porcine) Inj 1000 Unit/ML: INTRAMUSCULAR | Qty: 30 | Status: AC

## 2012-11-14 NOTE — Evaluation (Signed)
Physical Therapy Evaluation Patient Details Name: Breanna Holloway MRN: [id number] DOB: 12/26/1970 Today's Date: 11/14/2012 Time: 0912-0931 PT Time Calculation (min): 19 min  PT Assessment / Plan / Recommendation Clinical Impression  pt present with L5-S1 ALIF.  pt moving well and very motivated.  pt needing to go to x-ray at end of session, so discussed with pt sitting up in chair on return to room with Nsg A.  Anticipate great progress.      PT Assessment  Patient needs continued PT services    Follow Up Recommendations  Supervision - Intermittent;Home health PT    Does the patient have the potential to tolerate intense rehabilitation      Barriers to Discharge None      Equipment Recommendations  Rolling walker with 5" wheels    Recommendations for Other Services OT consult   Frequency Min 5X/week    Precautions / Restrictions Precautions Precautions: Back Precaution Booklet Issued: Yes (comment) Precaution Comments: Gave pt English copy of back precautions.  Will look for Spanish version.   Required Braces or Orthoses: Spinal Brace Spinal Brace: Applied in sitting position Restrictions Weight Bearing Restrictions: No   Pertinent Vitals/Pain Indicates pain is minimal.        Mobility  Bed Mobility Bed Mobility: Rolling Right;Right Sidelying to Sit;Sitting - Scoot to [place] of Bed;Sit to Sidelying Right Rolling Right: 5: Supervision Right Sidelying to Sit: 5: Supervision Sitting - Scoot to Edge of Bed: 5: Supervision Sit to Sidelying Right: 5: Supervision Details for Bed Mobility Assistance: cues for log roll technique and back precautions, but pt able to perform without physical A.   Transfers Transfers: Sit to Stand;Stand to Sit Sit to Stand: 4: Min guard;With upper extremity assist;From bed;From toilet Stand to Sit: 4: Min guard;With upper extremity assist;To toilet;To bed Details for Transfer Assistance: cues for UE use and controlling descent to toilet  and bed.   Ambulation/Gait Ambulation/Gait Assistance: 4: Min guard Ambulation Distance (Feet): 20 Feet (x2) Assistive device: Rolling walker Ambulation/Gait Assistance Details: cues for use of RW, upright posture, positioning in RW.   Gait Pattern: Step-through pattern;Decreased stride length Stairs: No Wheelchair Mobility Wheelchair Mobility: No    Shoulder Instructions     Exercises     PT Diagnosis: Difficulty walking  PT Problem List: Decreased activity tolerance;Decreased balance;Decreased mobility;Decreased knowledge of use of DME;Decreased knowledge of precautions PT Treatment Interventions: DME instruction;Gait training;Stair training;Functional mobility training;Therapeutic activities;Therapeutic exercise;Balance training;Neuromuscular re-education;Patient/family education   PT Goals Acute Rehab PT Goals PT Goal Formulation: With patient Time For Goal Achievement: 11/21/12 Potential to Achieve Goals: Good Pt will Roll Supine to Right Side: with modified independence PT Goal: Rolling Supine to Right Side - Progress: Goal set today Pt will go Supine/Side to Sit: with modified independence PT Goal: Supine/Side to Sit - Progress: Goal set today Pt will go Sit to Supine/Side: with modified independence PT Goal: Sit to Supine/Side - Progress: Goal set today Pt will go Sit to Stand: with modified independence PT Goal: Sit to Stand - Progress: Goal set today Pt will go Stand to Sit: with modified independence PT Goal: Stand to Sit - Progress: Goal set today Pt will Ambulate: >150 feet;with modified independence;with rolling walker PT Goal: Ambulate - Progress: Goal set today Pt will Go Up / Down Stairs: 3-5 stairs;with min assist;with least restrictive assistive device PT Goal: Up/Down Stairs - Progress: Goal set today Additional Goals Additional Goal #1: pt will verbalize and follow back precautions.   PT Goal: Additional Goal #  1 - Progress: Goal set today  Visit  Information  Last PT Received On: 11/14/12 Assistance Needed: +1    Subjective Data  Subjective: It's not too bad.   Patient Stated Goal: Home    Prior Functioning  Home Living Lives With:  (Mother) Available Help at Discharge: Family;Available 24 hours/day Type of Home: House Home Access: Stairs to enter [employer] of Steps: 3 or 4 Entrance Stairs-Rails: None Home Layout: One level Home Adaptive Equipment: None Prior Function Level of Independence: Independent Able to Take Stairs?: Yes Vocation: Full time employment Communication Communication: Prefers language other than English (Speaks English well, but Spanish is primary language.  )    Cognition  Overall Cognitive Status: Appears within functional limits for tasks assessed/performed Arousal/Alertness: Awake/alert Orientation Level: Appears intact for tasks assessed Behavior During Session: [facility] for tasks performed    Extremity/Trunk Assessment Right Lower Extremity Assessment RLE ROM/Strength/Tone: WFL for tasks assessed RLE Sensation: WFL - Light Touch Left Lower Extremity Assessment LLE ROM/Strength/Tone: WFL for tasks assessed LLE Sensation: WFL - Light Touch Trunk Assessment Trunk Assessment: Normal   Balance Balance Balance Assessed: No  End of Session PT - End of Session Equipment Utilized During Treatment: Gait belt;Back brace Activity Tolerance: Patient tolerated treatment well Patient left: in bed;with family/visitor present (with transport to go to X-ray.  ) Nurse Communication: Mobility status  GP     Marlynn Hinckley, Alison Murray, PT [phone] 11/14/2012, 10:58 AM

## 2012-11-14 NOTE — Evaluation (Signed)
Occupational Therapy Evaluation Patient Details Name: Breanna Holloway MRN: [id number] DOB: Mar 23, 1971 Today's Date: 11/14/2012 Time: 9147-8295 OT Time Calculation (min): 31 min  OT Assessment / Plan / Recommendation Clinical Impression  Pt demos decline in function with ADLs, strength, balance and safety following back surgery. Pt would benefit from skilled OT services to address these impairments to restore PLOF to return home safely    OT Assessment  Patient needs continued OT Services    Follow Up Recommendations       Barriers to Discharge None    Equipment Recommendations  3 in 1 bedside comode;Tub/shower bench;Other (comment) (ADL A/E kit)    Recommendations for Other Services    Frequency  Min 2X/week    Precautions / Restrictions Precautions Precautions: Back Precaution Comments: Pt able to recall 3/3 back precautions Required Braces or Orthoses: Spinal Brace Spinal Brace: Applied in sitting position Restrictions Weight Bearing Restrictions: No   Pertinent Vitals/Pain 2/10    ADL  Grooming: Performed;Wash/dry hands;Wash/dry face;Min guard Where Assessed - Grooming: Supported standing Upper Body Bathing: Simulated;Supervision/safety;Set up Where Assessed - Upper Body Bathing: Unsupported sitting Lower Body Bathing: Simulated;Minimal assistance Where Assessed - Lower Body Bathing: Supported sitting;Supported sit to stand Upper Body Dressing: Performed;Supervision/safety;Set up Where Assessed - Upper Body Dressing: Unsupported sitting Lower Body Dressing: Performed;Minimal assistance Where Assessed - Lower Body Dressing: Unsupported sitting;Supported sit to stand Toilet Transfer: Performed;Min guard Toilet Transfer Method: Other (comment) (ambulating from RW level) Toilet Transfer Equipment: Raised toilet seat with arms (or 3-in-1 over toilet);Grab bars Toileting - Clothing Manipulation and Hygiene: Performed;Minimal assistance Where Assessed - International aid/development worker and Hygiene: Standing Tub/Shower Transfer: Performed;Min guard Web designer Method: Science writer: Information systems manager with back;Grab bars Equipment Used: Rolling walker;Sock aid;Long-handled shoe horn;Long-handled sponge;Reacher (3 in 1, tub bench) ADL Comments: Pt and her sister educated with demo on ADL A/E for home use, tub bench trg    OT Diagnosis: Generalized weakness  OT Problem List: Decreased strength;Decreased knowledge of use of DME or AE;Pain;Decreased activity tolerance OT Treatment Interventions: Self-care/ADL training;Therapeutic activities;Therapeutic exercise;Neuromuscular education;DME and/or AE instruction;Patient/family education;Balance training   OT Goals Acute Rehab OT Goals OT Goal Formulation: With patient Time For Goal Achievement: 11/21/12 Potential to Achieve Goals: Good ADL Goals Pt Will Perform Grooming: with set-up;with supervision;Standing at sink ADL Goal: Grooming - Progress: Goal set today Pt Will Perform Lower Body Bathing: with set-up;with supervision;Sitting in shower;Sit to stand in shower;Supported;with adaptive equipment ADL Goal: Lower Body Bathing - Progress: Goal set today Pt Will Perform Lower Body Dressing: with supervision;with set-up;Supported;Sitting, chair;Sitting, bed;Sit to stand from chair;Sit to stand from bed;with adaptive equipment ADL Goal: Lower Body Dressing - Progress: Goal set today Pt Will Transfer to Toilet: with supervision;with DME;Grab bars ADL Goal: Toilet Transfer - Progress: Goal set today Pt Will Perform Toileting - Clothing Manipulation: with supervision;Standing ADL Goal: Toileting - Clothing Manipulation - Progress: Goal set today Pt Will Perform Toileting - Hygiene: with supervision;Sit to stand from 3-in-1/toilet;with caregiver independent in assisting ADL Goal: Toileting - Hygiene - Progress: Goal set today Pt Will Perform Tub/Shower Transfer: with  supervision;Transfer tub bench;Grab bars;Maintaining back safety precautions;with DME ADL Goal: Tub/Shower Transfer - Progress: Goal set today  Visit Information  Last OT Received On: 11/14/12    Subjective Data  Subjective: " I am not hurting that much right now " Patient Stated Goal: To return home   Prior Functioning     Home Living Lives With: Other (Comment) (mother and teenage dtr)  Available Help at Discharge: Family;Available 24 hours/day Type of Home: House Home Access: Stairs to enter [employer] of Steps: 3 Entrance Stairs-Rails: None Home Layout: One level Bathroom Shower/Tub: Engineer, manufacturing systems: Standard Home Adaptive Equipment: None Prior Function Level of Independence: Independent Able to Take Stairs?: Yes Driving: Yes Vocation: Full time employment Communication Communication: No difficulties;Prefers language other than English;Other (comment) (Spanish is primary langauge, speaks [place] well) Dominant Hand: Right         Vision/Perception Vision - Assessment Eye Alignment: Within Functional Limits Perception Perception: Within Functional Limits   Cognition  Overall Cognitive Status: Appears within functional limits for tasks assessed/performed Arousal/Alertness: Awake/alert Orientation Level: Appears intact for tasks assessed Behavior During Session: [facility] for tasks performed    Extremity/Trunk Assessment Right Upper Extremity Assessment RUE ROM/Strength/Tone: Within functional levels Left Upper Extremity Assessment LUE ROM/Strength/Tone: Within functional levels     Mobility Bed Mobility Bed Mobility: Rolling Right;Right Sidelying to Sit;Sitting - Scoot to Edge of Bed;Sit to Sidelying Right Right Sidelying to Sit: 5: Supervision Sitting - Scoot to Edge of Bed: 5: Supervision Sit to Sidelying Right: 5: Supervision Details for Bed Mobility Assistance: able to perform log roll without cuing Transfers Transfers: Sit to  Stand;Stand to Sit Sit to Stand: 4: Min guard;From bed;From chair/3-in-1;Other (comment);With upper extremity assist (tub bench) Stand to Sit: To chair/3-in-1;To bed;With upper extremity assist;With armrests;Other (comment) (tub bench)     Shoulder Instructions     Exercise     Balance Balance Balance Assessed: No   End of Session OT - End of Session Equipment Utilized During Treatment: Back brace;Other (comment) (RW, 3 in 1 tub bench) Activity Tolerance: Patient tolerated treatment well Patient left: in bed;with call bell/phone within reach;with family/visitor present  GO     Galen Manila 11/14/2012, 3:02 PM

## 2012-11-14 NOTE — Progress Notes (Signed)
    Subjective: Procedure(s) (LRB): ANTERIOR LUMBAR FUSION 1 LEVEL (N/A) ABDOMINAL EXPOSURE (N/A) 1 Day Post-Op  Patient reports pain as 3 on 0-10 scale.  Reports decreased leg pain reports incisional back pain   Foley removed - no void as of yet Negative bowel movement Positive flatus Negative chest pain or shortness of breath  Objective: Vital signs in last 24 hours: Temp:  [97.2 F (36.2 C)-98.9 F (37.2 C)] 98.9 F (37.2 C) (01/17 0654) Pulse Rate:  [77-93] 85  (01/17 0654) Resp:  [10-16] 16  (01/17 0654) BP: (102-123)/(56-65) 104/56 mmHg (01/17 0654) SpO2:  [100 %] 100 % (01/17 0654) FiO2 (%):  [38 %] 38 % (01/16 2000)  Intake/Output from previous day: 01/16 0701 - 01/17 0700 In: 3619 [P.O.:360; I.V.:3009; IV Piggyback:250] Out: 1575 [Urine:1475; Blood:100]  Labs: No results found for this basename: WBC:2,RBC:2,HCT:2,PLT:2 in the last 72 hours No results found for this basename: NA:2,K:2,CL:2,CO2:2,BUN:2,CREATININE:2,GLUCOSE:2,CALCIUM:2 in the last 72 hours No results found for this basename: LABPT:2,INR:2 in the last 72 hours  Physical Exam: Neurologically intact ABD soft Neurovascular intact Intact pulses distally Dorsiflexion/Plantar flexion intact Incision: dressing C/D/I and no drainage Compartment soft  Assessment/Plan: Patient stable  xrays pending Continue mobilization with physical therapy Continue care  Advance diet Up with therapy D/C IV fluids Remove foley this AM Mobilization with PT today Await LE doppler today Possible d/c over there week-end  Venita Lick, MD [facility] [phone]

## 2012-11-14 NOTE — Progress Notes (Signed)
VASCULAR PROGRESS NOTE  SUBJECTIVE: Comfortable   PHYSICAL EXAM: Filed Vitals:   11/13/12 2000 11/14/12 0000 11/14/12 0400 11/14/12 0654  BP:    104/56  Pulse:    85  Temp:    98.9 F (37.2 C)  TempSrc:      Resp: 13 14 11 16   SpO2: 100% 100% 100% 100%   Palpable pedal pulses on left Dressing dry  ASSESSMENT/PLAN: 1. 1 Day Post-Op s/p:Anterior retroperitoneal exposure of L5-S1 2. Doing well. Will be available if needed.  Cari Caraway Beeper: [phone] 11/14/2012

## 2012-11-14 NOTE — Discharge Summary (Signed)
Patient ID: Breanna Holloway MRN: [id number] DOB/AGE: [birth year] 41 y.o.  Admit date: 11/13/2012 Discharge date: 11/14/2012  Admission Diagnoses:  Principal Problem:  *Back pain Active Problems:  Degeneration of intervertebral disc, site unspecified   Discharge Diagnoses:  Principal Problem:  *Back pain Active Problems:  Degeneration of intervertebral disc, site unspecified  status post Procedure(s): ANTERIOR LUMBAR FUSION 1 LEVEL ABDOMINAL EXPOSURE  Past Medical History  Diagnosis Date  . Lower back pain 08/13/12    Central Back L 4-5 and  L 5- S 1    Cortisone injections  . Family history of anesthesia complication     N/V  . Asthma   . Constipation     Surgeries: Procedure(s): ANTERIOR LUMBAR FUSION 1 LEVEL ABDOMINAL EXPOSURE on 11/13/2012   Consultants: Treatment Team:  Chuck Hint, MD  Discharged Condition: Improved  Hospital Course: Breanna Holloway is an 42 y.o. female who was admitted 11/13/2012 for operative treatment of Back pain. Patient failed conservative treatments (please see the history and physical for the specifics) and had severe unremitting pain that affects sleep, daily activities and work/hobbies. After pre-op clearance, the patient was taken to the operating room on 11/13/2012 and underwent  Procedure(s): ANTERIOR LUMBAR FUSION 1 LEVEL ABDOMINAL EXPOSURE.    Patient was given perioperative antibiotics: Anti-infectives     Start     Dose/Rate Route Frequency Ordered Stop   11/13/12 1600   ceFAZolin (ANCEF) IVPB 1 g/50 mL premix        1 g 100 mL/hr over 30 Minutes Intravenous Every 8 hours 11/13/12 1301 11/14/12 0125   11/12/12 1403   ceFAZolin (ANCEF) IVPB 2 g/50 mL premix        2 g 100 mL/hr over 30 Minutes Intravenous 30 min pre-op 11/12/12 1403 11/13/12 0800           Patient was given sequential compression devices and early ambulation to prevent DVT.   Patient benefited maximally from hospital stay and  there were no complications. At the time of discharge, the patient was urinating/moving their bowels without difficulty, tolerating a regular diet, pain is controlled with oral pain medications and they have been cleared by PT/OT.   Recent vital signs: Patient Vitals for the past 24 hrs:  BP Temp Pulse Resp SpO2  11/14/12 0654 104/56 mmHg 98.9 F (37.2 C) 85  16  100 %  11/14/12 0400 - - - 11  100 %  11/14/12 0000 - - - 14  100 %  11/13/12 2000 - - - 13  100 %  11/13/12 1600 - - 80  12  100 %  11/13/12 1400 102/57 mmHg 97.9 F (36.6 C) 93  15  100 %  11/13/12 1215 - 97.9 F (36.6 C) - - -  11/13/12 1200 107/60 mmHg - 77  10  100 %  11/13/12 1145 106/58 mmHg - - - -  11/13/12 1130 111/63 mmHg - - - -  11/13/12 1120 123/65 mmHg - 93  12  100 %  11/13/12 1034 - 97.2 F (36.2 C) - - 100 %     Recent laboratory studies: No results found for this basename: WBC:2,HGB:2,HCT:2,PLT:2,NA:2,K:2,CL:2,CO2:2,BUN:2,CREATININE:2,GLUCOSE:2,PT:2,INR:2,CALCIUM,2: in the last 72 hours   Discharge Medications:     Medication List     As of 11/14/2012  7:26 AM    STOP taking these medications         traMADol 50 MG tablet   Commonly known as: ULTRAM      TAKE  these medications         albuterol (5 MG/ML) 0.5% nebulizer solution   Commonly known as: PROVENTIL   Take 2.5 mg by nebulization every 6 (six) hours as needed.      docusate sodium 100 MG capsule   Commonly known as: COLACE   Take 1 capsule (100 mg total) by mouth 3 (three) times daily as needed for constipation.      enoxaparin 30 MG/0.3ML injection   Commonly known as: LOVENOX   Inject 0.3 mLs (30 mg total) into the skin every 12 (twelve) hours.      fluticasone 50 MCG/ACT nasal spray   Commonly known as: FLONASE   Place 1 spray into the nose daily.      methocarbamol 500 MG tablet   Commonly known as: ROBAXIN   Take 1 tablet (500 mg total) by mouth 3 (three) times daily as needed.      multivitamin with minerals Tabs    Take 1 tablet by mouth daily.      ondansetron 4 MG tablet   Commonly known as: ZOFRAN   Take 1 tablet (4 mg total) by mouth every 8 (eight) hours as needed for nausea.      oxyCODONE-acetaminophen 10-325 MG per tablet   Commonly known as: PERCOCET   Take 1 tablet by mouth every 4 (four) hours as needed for pain.      polyethylene glycol powder powder   Commonly known as: GLYCOLAX/MIRALAX   Take 17 g by mouth daily.        Diagnostic Studies: Dg Lumbar Spine 2-3 Views  11/13/2012  *RADIOLOGY REPORT*  Clinical Data: L5-S1 DISC REPLACEMENT.  DG C-ARM 61-120 MIN,LUMBAR SPINE - 2-3 VIEW  Comparison: None.  Findings: Changes of disc replacement at L5-S1.  Normal alignment. No hardware or bony complicating feature.  IMPRESSION: L5-S1 anterior fusion.   Original Report Authenticated By: Charlett Nose, M.D.    Dg Lumbar Spine 2-3 Views  11/13/2012  *RADIOLOGY REPORT*  Clinical Data: Preop anterior lumbar fusion  LUMBAR SPINE - 2-3 VIEW  Comparison: Lumbar spine CT dated 05/09/2012  Findings: Five lumbar-type vertebral bodies.  Normal lumbar lordosis.  No evidence of fracture or dislocation.  Vertebral body heights intervertebral disc spaces are maintained.  Mild degenerative changes at L2-3.  IMPRESSION: Mild degenerative changes.   Original Report Authenticated By: Charline Bills, M.D.    Dg C-arm [phone] Min  11/13/2012  *RADIOLOGY REPORT*  Clinical Data: L5-S1 DISC REPLACEMENT.  DG C-ARM 61-120 MIN,LUMBAR SPINE - 2-3 VIEW  Comparison: None.  Findings: Changes of disc replacement at L5-S1.  Normal alignment. No hardware or bony complicating feature.  IMPRESSION: L5-S1 anterior fusion.   Original Report Authenticated By: Charlett Nose, M.D.    Dg Or Local Abdomen  11/13/2012  *RADIOLOGY REPORT*  Clinical Data: Post L5-S1 ALIF  OR LOCAL ABDOMEN  Comparison: 11/13/2012 at 0754 hours  Findings: Postsurgical changes at L5-S1.  No radiopaque foreign body is seen.  IMPRESSION: No radiopaque foreign body  is seen.   Original Report Authenticated By: Charline Bills, M.D.         Discharge Plan:  discharge to home  Disposition:  Stable Tolerating reg diet Ok for home d/c - f/u 2 weeks lovenox for 10 days for DVT prevention   Signed: Venita Lick D for Dr. Venita Lick [facility] [phone] 11/14/2012, 7:26 AM

## 2012-11-14 NOTE — Plan of Care (Signed)
Problem: Phase II Progression Outcomes Goal: Discharge plan established Recommend HH OT for ADL and ADL mobility safety, tub bench, 3 in 1 and ADL A/E kit for home after acute care d/c

## 2012-11-15 NOTE — Progress Notes (Signed)
Occupational Therapy Treatment Patient Details Name: Breanna Holloway MRN: [id number] DOB: 07/23/1971 Today's Date: 11/15/2012 Time: 0930-1000 OT Time Calculation (min): 30 min  OT Assessment / Plan / Recommendation Comments on Treatment Session      Follow Up Recommendations       Barriers to Discharge       Equipment Recommendations  3 in 1 bedside comode;Tub/shower bench;Other (comment) (ADL A/E kit)    Recommendations for Other Services    Frequency Min 2X/week   Plan Discharge plan remains appropriate    Precautions / Restrictions Precautions Precautions: Back Required Braces or Orthoses: Spinal Brace Spinal Brace: Applied in sitting position   Pertinent Vitals/Pain 0/10 for pain    ADL  Grooming: Performed;Wash/dry face;Supervision/safety (cues for tech. for maintianing back precautions during task) Where Assessed - Grooming: Supported standing Upper Body Dressing: Performed;Supervision/safety;Set up (able to don back brace ) Toilet Transfer: Performed;Supervision/safety Toilet Transfer Method: Other (comment) (ambulating in b.room with rw) Toilet Transfer Equipment: Raised toilet seat with arms (or 3-in-1 over toilet) Toileting - Clothing Manipulation and Hygiene: Simulated;Supervision/safety Where Assessed - Toileting Clothing Manipulation and Hygiene: Standing Transfers/Ambulation Related to ADLs: in/out of bed, ambulating in room ADL Comments: pt. verbalized understanding of a/e that was reviewed with her yesterday states she has no questions, also states she feels comfortable with tub transfer that was performed yesterday    OT Diagnosis:    OT Problem List:   OT Treatment Interventions:     OT Goals ADL Goals ADL Goal: Grooming - Progress: Met ADL Goal: Toilet Transfer - Progress: Met ADL Goal: Toileting - Clothing Manipulation - Progress: Met ADL Goal: Toileting - Hygiene - Progress: Met  Visit Information  Last OT Received On:  11/15/12 Assistance Needed: +1    Subjective Data  Subjective: "i am so happy that i do not have a lot of pain"   Prior Functioning       Cognition  Overall Cognitive Status: Appears within functional limits for tasks assessed/performed Arousal/Alertness: Awake/alert Orientation Level: Appears intact for tasks assessed Behavior During Session: [facility] for tasks performed    Mobility  Shoulder Instructions Bed Mobility Bed Mobility: Rolling Left;Right Sidelying to Sit;Supine to Sit;Sitting - Scoot to [place] of Bed Rolling Right: 5: Supervision Right Sidelying to Sit: 5: Supervision;HOB flat Supine to Sit: 5: Supervision;HOB flat Sitting - Scoot to Edge of Bed: 5: Supervision Sit to Sidelying Right: 5: Supervision;HOB flat Details for Bed Mobility Assistance: peformed log rolling without cues and able to with hob flat and no rails to simulate bed at home Transfers Transfers: Sit to Stand;Stand to Sit Sit to Stand: 5: Supervision Stand to Sit: To chair/3-in-1;To bed       Exercises      Balance     End of Session    GO     Robet Leu 11/15/2012, 10:17 AM

## 2012-11-15 NOTE — Progress Notes (Signed)
Pt to go home on lovenox. lovenox teaching done with pt and family. Mock return demonstration complete. Pt and family verbalize understanding. All questions answered.

## 2012-11-15 NOTE — Progress Notes (Signed)
NCM spoke to pt and states she is working with Case Worker, Amy Fannis # [phone] ext [phone]. States she is needing RW, 3n1 and tub bench. Explained she can Building surveyor from gift shop or DME supply store. Will fax orders to Case Worker for [facility] for set up and DME reimbursement to [facility]. Will follow up with Case Worker on 11/17/2012.  Isidoro Donning RN CCM Case Mgmt phone [phone]

## 2012-11-15 NOTE — Progress Notes (Signed)
Physical Therapy Treatment Patient Details Name: Breanna Holloway MRN: [id number] DOB: Mar 20, 1971 Today's Date: 11/15/2012 Time: 3086-5784 PT Time Calculation (min): 13 min  PT Assessment / Plan / Recommendation Comments on Treatment Session  Patient progressing well. Plans on discharge today. Waiting for equipment to be delivered    Follow Up Recommendations  Supervision - Intermittent     Does the patient have the potential to tolerate intense rehabilitation     Barriers to Discharge        Equipment Recommendations  Rolling walker with 5" wheels    Recommendations for Other Services    Frequency Min 5X/week   Plan Discharge plan remains appropriate;Frequency remains appropriate    Precautions / Restrictions Precautions Precautions: Back Precaution Comments: patient able to recall and maintain all precautions Required Braces or Orthoses: Spinal Brace Spinal Brace: Applied in sitting position   Pertinent Vitals/Pain     Mobility  Bed Mobility Bed Mobility: Rolling Left;Right Sidelying to Sit;Supine to Sit;Sitting - Scoot to [place] of Bed Rolling Right: 6: Modified independent (Device/Increase time) Right Sidelying to Sit: 6: Modified independent (Device/Increase time) Supine to Sit: 5: Supervision;HOB flat Sitting - Scoot to Edge of Bed: 5: Supervision Sit to Sidelying Right: 5: Supervision;HOB flat Details for Bed Mobility Assistance: peformed log rolling without cues and able to with hob flat and no rails to simulate bed at home Transfers Sit to Stand: 6: Modified independent (Device/Increase time) Stand to Sit: 6: Modified independent (Device/Increase time) Ambulation/Gait Ambulation/Gait Assistance: 5: Supervision Ambulation Distance (Feet): 200 Feet Assistive device: None Ambulation/Gait Assistance Details: Patient guarded but no LOB without RW Gait Pattern: Step-through pattern Stairs: Yes Stairs Assistance: 4: Min guard Stair Management Technique:  Alternating pattern;Forwards Number of Stairs: 4     Exercises     PT Diagnosis:    PT Problem List:   PT Treatment Interventions:     PT Goals Acute Rehab PT Goals PT Goal: Rolling Supine to Right Side - Progress: Met PT Goal: Supine/Side to Sit - Progress: Met PT Goal: Sit to Supine/Side - Progress: Met PT Goal: Sit to Stand - Progress: Met PT Goal: Stand to Sit - Progress: Met PT Goal: Ambulate - Progress: Progressing toward goal PT Goal: Up/Down Stairs - Progress: Met Additional Goals PT Goal: Additional Goal #1 - Progress: Met  Visit Information  Last PT Received On: 11/15/12 Assistance Needed: +1    Subjective Data      Cognition  Overall Cognitive Status: Appears within functional limits for tasks assessed/performed Arousal/Alertness: Awake/alert Orientation Level: Appears intact for tasks assessed Behavior During Session: [facility] for tasks performed    Balance     End of Session PT - End of Session Equipment Utilized During Treatment: Gait belt Activity Tolerance: Patient tolerated treatment well Patient left: in bed;with call bell/phone within reach Nurse Communication: Mobility status   GP     Fredrich Birks 11/15/2012, 10:38 AM 11/15/2012 Fredrich Birks PTA [phone] pager [phone] office

## 2012-11-17 NOTE — Progress Notes (Signed)
NCM made outreach to [employer], Macarthur Critchley [phone], ext [phone] with [employer]. Amy Fannis, [employer] rep, ext [phone] is out of the office. Left voice message for return call to fax over paperwork post d/c. Isidoro Donning RN CCM Case Mgmt phone [phone]

## 2012-11-17 NOTE — Progress Notes (Signed)
Received call back from Macarthur Critchley, [employer], Fax orders, facesheet, and d/c summary to [employer] rep at # [phone]. Isidoro Donning RN CCM Case Mgmt phone [phone]

## 2013-08-10 IMAGING — RF DG LUMBAR SPINE 2-3V
1 series · 2 of 2 positions shown · non-contrast
Comparison: None.

CLINICAL DATA: L5-S1 DISC REPLACEMENT.

DG C-ARM 61-120 MIN,LUMBAR SPINE - 2-3 VIEW

[Series 1: run · 2 of 2 slices shown]
[im 1/2]
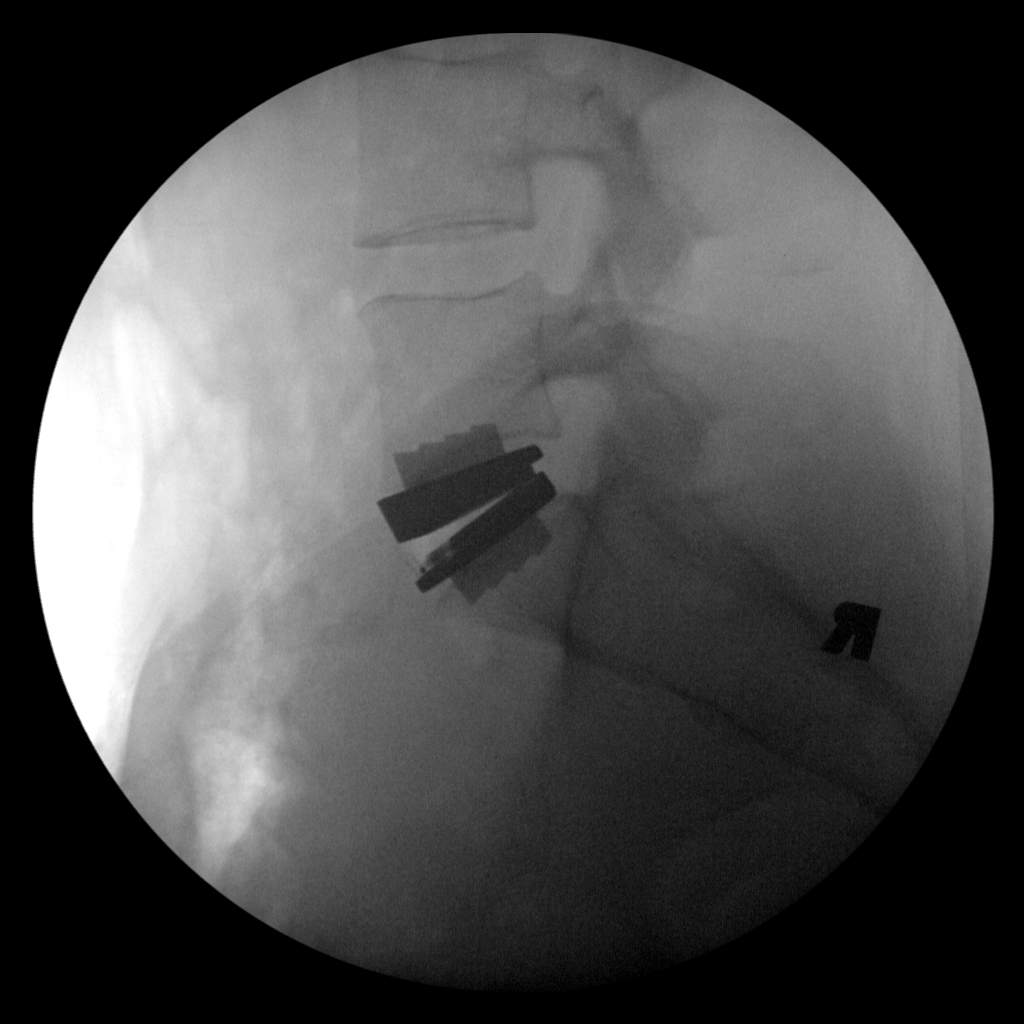
[im 2/2]
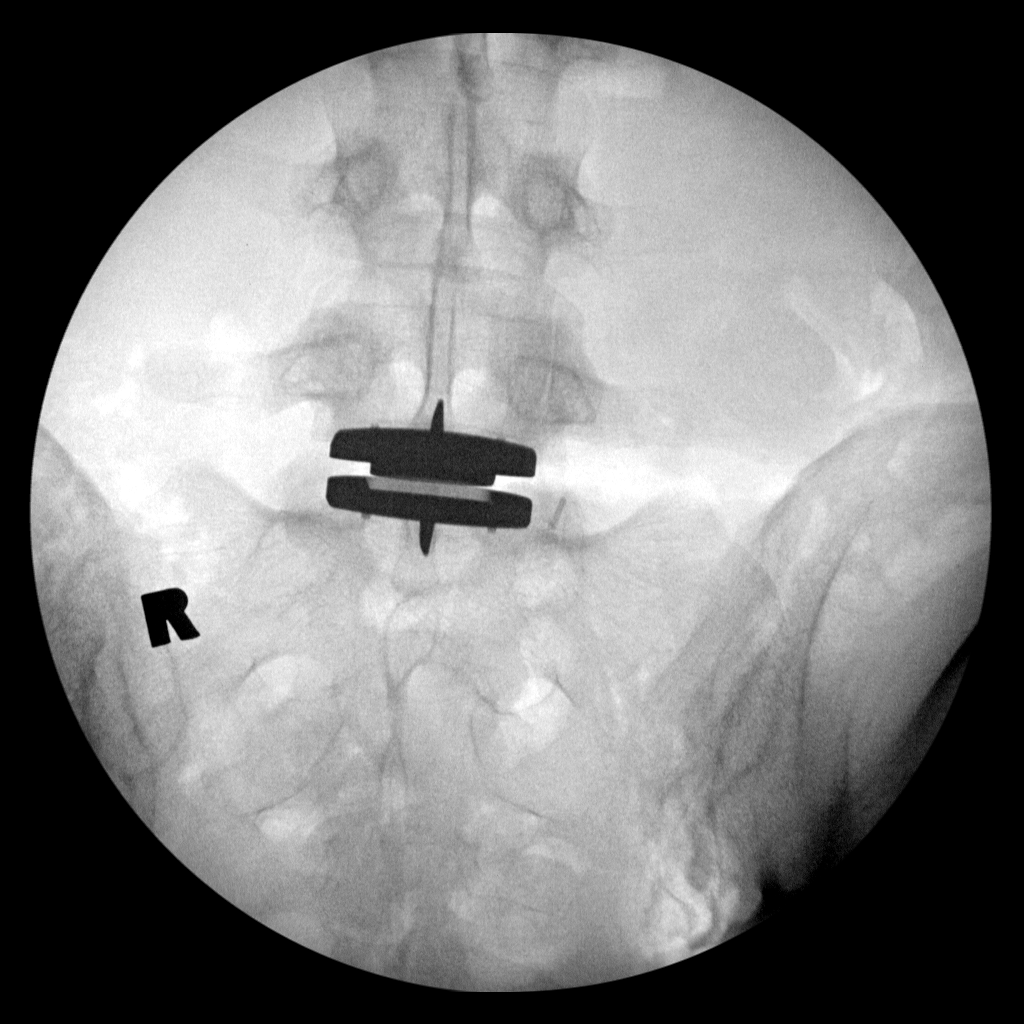

[2 of 2 positions shown; findings below may reference images not displayed]

FINDINGS: Changes of disc replacement at L5-S1.  Normal alignment.
No hardware or bony complicating feature.
IMPRESSION: L5-S1 anterior fusion.

## 2013-08-11 IMAGING — CR DG LUMBAR SPINE 2-3V
2 series · 2 of 2 positions shown · non-contrast
Comparison: Post discogram CT 05/09/2012.  Intraoperative films
11/13/2012.

CLINICAL DATA: Status post total disc replacement

LUMBAR SPINE - 2-3 VIEW

[t l-spine a.p.]
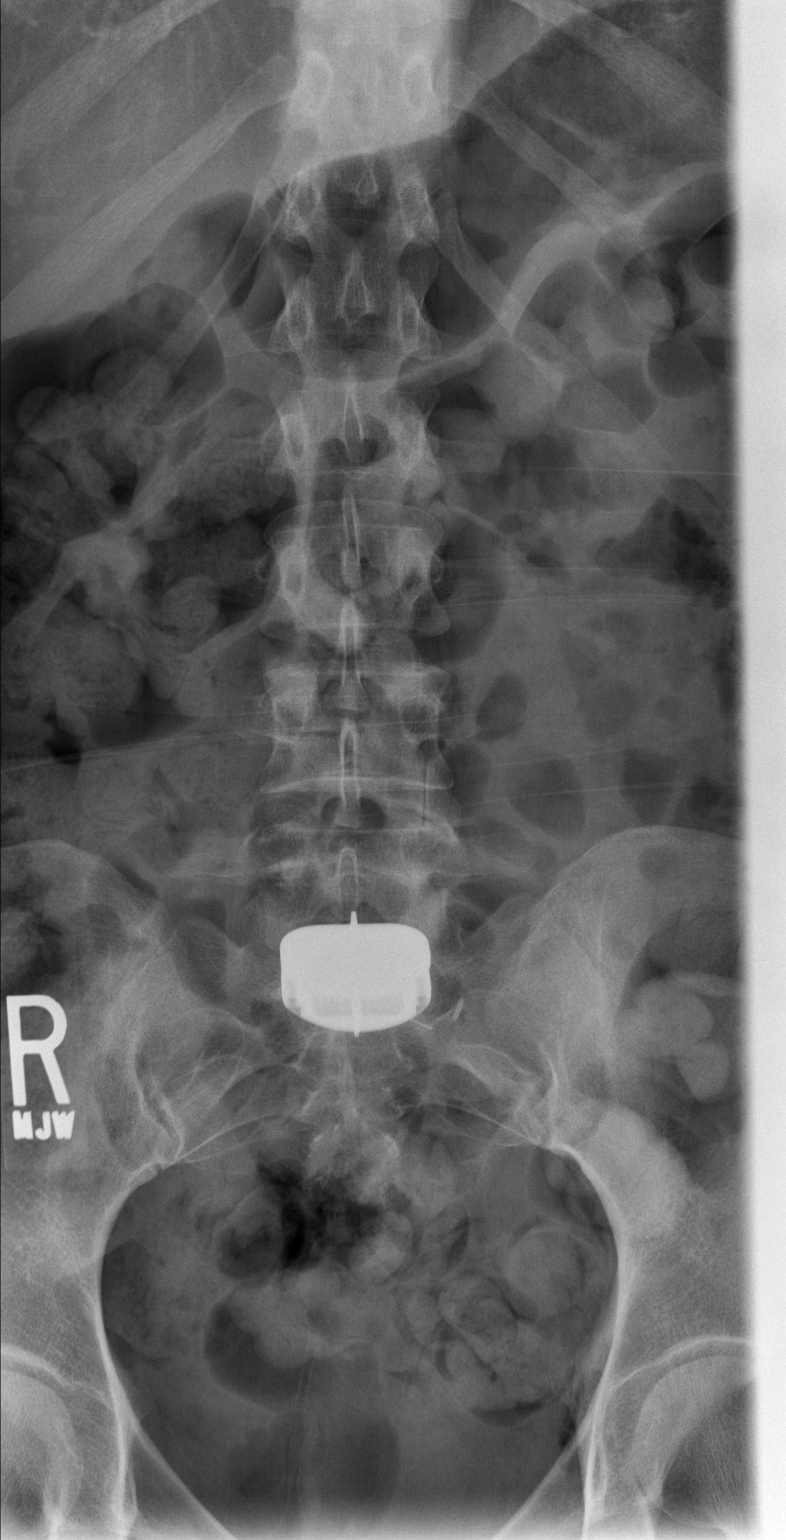

[t l-spine lat]
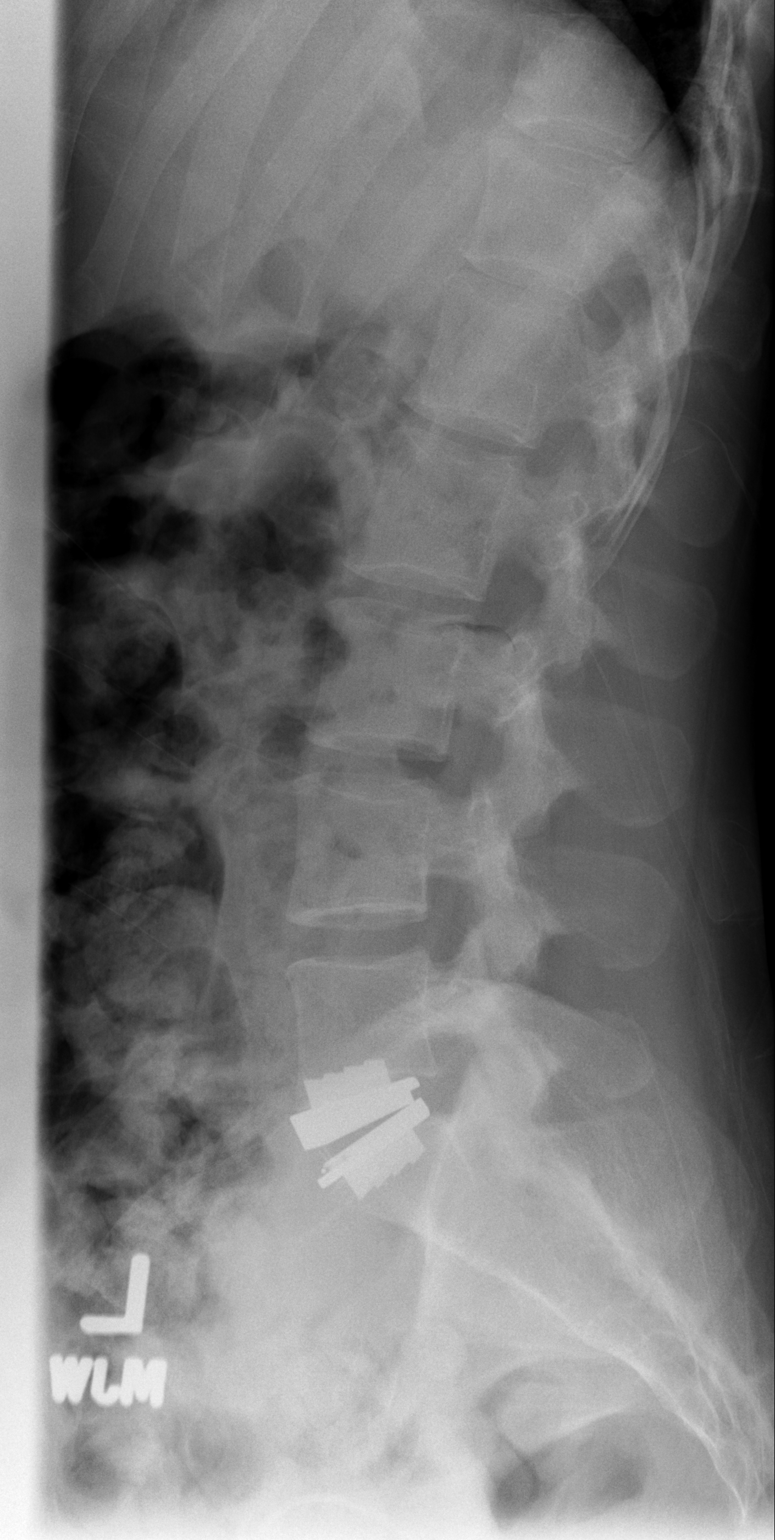

[2 of 2 positions shown; findings below may reference images not displayed]

FINDINGS: AP and lateral lumbar radiographs demonstrate a Pro-Disc
device for lumbar total disc arthroplasty at L5-S1. The implant is
centered within the interspace.  Small surgical clips are noted in
the retroperitoneum anteriorly.
IMPRESSION: Satisfactory postoperative appearance status post L5-S1 total disc
arthroplasty.

## 2015-12-09 ENCOUNTER — Emergency Department (HOSPITAL_BASED_OUTPATIENT_CLINIC_OR_DEPARTMENT_OTHER)
Admission: EM | Admit: 2015-12-09 | Discharge: 2015-12-09 | Disposition: A | Discharge status: Home / Self Care | Payer: Worker's Compensation | Attending: Physician Assistant | Admitting: Physician Assistant

## 2015-12-09 ENCOUNTER — Encounter (HOSPITAL_BASED_OUTPATIENT_CLINIC_OR_DEPARTMENT_OTHER): Payer: Self-pay | Admitting: *Deleted

## 2015-12-09 DIAGNOSIS — J45909 Unspecified asthma, uncomplicated: Secondary | ICD-10-CM | POA: Insufficient documentation

## 2015-12-09 DIAGNOSIS — Y9389 Activity, other specified: Secondary | ICD-10-CM | POA: Insufficient documentation

## 2015-12-09 DIAGNOSIS — K5900 Constipation, unspecified: Secondary | ICD-10-CM | POA: Diagnosis not present

## 2015-12-09 DIAGNOSIS — Z79899 Other long term (current) drug therapy: Secondary | ICD-10-CM | POA: Insufficient documentation

## 2015-12-09 DIAGNOSIS — S3992XA Unspecified injury of lower back, initial encounter: Secondary | ICD-10-CM | POA: Insufficient documentation

## 2015-12-09 DIAGNOSIS — S199XXA Unspecified injury of neck, initial encounter: Secondary | ICD-10-CM | POA: Diagnosis not present

## 2015-12-09 DIAGNOSIS — Z7951 Long term (current) use of inhaled steroids: Secondary | ICD-10-CM | POA: Diagnosis not present

## 2015-12-09 DIAGNOSIS — S80211A Abrasion, right knee, initial encounter: Secondary | ICD-10-CM | POA: Insufficient documentation

## 2015-12-09 DIAGNOSIS — W010XXA Fall on same level from slipping, tripping and stumbling without subsequent striking against object, initial encounter: Secondary | ICD-10-CM | POA: Insufficient documentation

## 2015-12-09 DIAGNOSIS — Z87891 Personal history of nicotine dependence: Secondary | ICD-10-CM | POA: Insufficient documentation

## 2015-12-09 DIAGNOSIS — Y990 Civilian activity done for income or pay: Secondary | ICD-10-CM | POA: Diagnosis not present

## 2015-12-09 DIAGNOSIS — Y9289 Other specified places as the place of occurrence of the external cause: Secondary | ICD-10-CM | POA: Insufficient documentation

## 2015-12-09 DIAGNOSIS — M549 Dorsalgia, unspecified: Secondary | ICD-10-CM

## 2015-12-09 MED ORDER — METHOCARBAMOL 500 MG PO TABS
500.0000 mg | ORAL_TABLET | Freq: Four times a day (QID) | ORAL | Status: AC | PRN
Start: 2015-12-09 — End: ?

## 2015-12-09 NOTE — Discharge Instructions (Signed)

## 2015-12-09 NOTE — ED Provider Notes (Signed)
CSN: [id number]     Arrival date & time 12/09/15  1119 History   First MD Initiated Contact with Patient 12/09/15 1320     Chief Complaint  Patient presents with  . Fall     (Consider location/radiation/quality/duration/timing/severity/associated sxs/prior Treatment) HPI   Patient is a 45 year old female with chronic back pain presenting with back pain after fall yesterday when leaving work.. Patient tripped over the curb when leaving work and fell onto her right hand and right knee. She sustained a less than 1 cm abrasion on her knee. Patient reports that last night she started having her back cramp up at this morning she had some back pain from the tip of her head all the way down to the bottom of her back. She did not hit her back at any time. She sustained no trauma.   no fevers no urinary incontinence no weakness no numbness.   Past Medical History  Diagnosis Date  . Lower back pain 08/13/12    Central Back L 4-5 and  L 5- S 1    Cortisone injections  . Family history of anesthesia complication     N/V  . Asthma   . Constipation    Past Surgical History  Procedure Laterality Date  . Anterior lumbar fusion  11/13/2012    Procedure: ANTERIOR LUMBAR FUSION 1 LEVEL;  Surgeon: Venita Lick, MD;  Location: [facility] OR;  Service: Orthopedics;  Laterality: N/A;  total disc replacement L5-S1  . Abdominal exposure  11/13/2012    Procedure: ABDOMINAL EXPOSURE;  Surgeon: Chuck Hint, MD;  Location: [facility] OR;  Service: Vascular;  Laterality: N/A;  exposure for anterior lumbar surgery   Family History  Problem Relation Age of Onset  . Diabetes Mother   . Hyperlipidemia Mother   . Hypertension Mother    Social History  Substance Use Topics  . Smoking status: Former Smoker    Types: Cigarettes    Quit date: 11/05/2009  . Smokeless tobacco: Never Used  . Alcohol Use: No   OB History    No data available     Review of Systems  Constitutional: Negative for activity change.   Respiratory: Negative for shortness of breath.   Cardiovascular: Negative for chest pain.  Gastrointestinal: Negative for abdominal pain.  Musculoskeletal: Positive for back pain and neck pain.      Allergies  Asa and Salicylates  Home Medications   Prior to Admission medications   Medication Sig Start Date End Date Taking? Authorizing Provider  Budesonide-Formoterol Fumarate (SYMBICORT IN) Inhale into the lungs.   Yes Historical Provider, MD  albuterol (PROVENTIL) (5 MG/ML) 0.5% nebulizer solution Take 2.5 mg by nebulization every 6 (six) hours as needed.    Historical Provider, MD  docusate sodium (COLACE) 100 MG capsule Take 1 capsule (100 mg total) by mouth 3 (three) times daily as needed for constipation. 11/14/12   Venita Lick, MD  enoxaparin (LOVENOX) 30 MG/0.3ML injection Inject 0.3 mLs (30 mg total) into the skin every 12 (twelve) hours. 11/14/12   Venita Lick, MD  fluticasone (FLONASE) 50 MCG/ACT nasal spray Place 1 spray into the nose daily.    Historical Provider, MD  methocarbamol (ROBAXIN) 500 MG tablet Take 1 tablet (500 mg total) by mouth 3 (three) times daily as needed. 11/14/12   Venita Lick, MD  Multiple Vitamin (MULTIVITAMIN WITH MINERALS) TABS Take 1 tablet by mouth daily.    Historical Provider, MD  ondansetron (ZOFRAN) 4 MG tablet Take 1 tablet (4 mg  total) by mouth every 8 (eight) hours as needed for nausea. 11/14/12   Venita Lick, MD  oxyCODONE-acetaminophen (PERCOCET) 10-325 MG per tablet Take 1 tablet by mouth every 4 (four) hours as needed for pain. 11/14/12   Venita Lick, MD  polyethylene glycol powder (GLYCOLAX) powder Take 17 g by mouth daily. 11/14/12   Venita Lick, MD   BP 133/83 mmHg  Pulse 76  Temp(Src) 97.5 F (36.4 C) (Oral)  Resp 20  Ht  (1.651 m)  Wt 140 lb (63.504 kg)  BMI 23.30 kg/m2  SpO2 95%  LMP 11/19/2015 Physical Exam  Constitutional: She is oriented to person, place, and time. She appears well-developed and  well-nourished.  HENT:  Head: Normocephalic and atraumatic.  Eyes: Right eye exhibits no discharge.  Cardiovascular: Normal rate, regular rhythm and normal heart sounds.   No murmur heard. Pulmonary/Chest: Effort normal and breath sounds normal.  Musculoskeletal:  No point tenderness along CT or L-spine. No evidence of trauma to the back.  Patient has less than 1 cm x 1 cm abrasion to right knee.  Full range of motion of bilateral lower extremities and upper extremities.  Neurological: She is oriented to person, place, and time.  Skin: Skin is warm and dry. She is not diaphoretic.  Psychiatric: She has a normal mood and affect.  Nursing note and vitals reviewed.   ED Course  Procedures (including critical care time) Labs Review Labs Reviewed - No data to display  Imaging Review No results found. I have personally reviewed and evaluated these images and lab results as part of my medical decision-making.   EKG Interpretation None      MDM   Final diagnoses:  None   Patient is a 45 year old female with history of chronic back pain presenting today with back pain exacerbated by a fall yesterday. Patient fell onto her outstretched right hand and right knee. She has full range of motion of both with no evidence of swelling or ecchymosis. Patient has no pain in either of these. She states states however that her back pain was exacerbated after the fall. She states she is allergic to everything aspirin and can't take Flexeril. She also says she is on a pain contract says can't take any opiates. We will give her Robaxin. I do not see any need for imaging given that she has no red flags and did not sustain any trauma to her back.  I told her to follow-up with her pain specialist and orthopedist if not improved in 1 week's time.    Jash Wahlen Randall An, MD 12/09/15 1347

## 2015-12-09 NOTE — ED Notes (Signed)
She slipped at work and fell in the parking lot yesterday. Injury to her right knee, hand, neck, and back. Work related injury.
